# Patient Record
Sex: Female | Born: 1970
Health system: Southern US, Community
[De-identification: ages and names within clinical notes are randomized; demographics above are authoritative.]

## PROBLEM LIST (undated history)

## (undated) DIAGNOSIS — E669 Obesity, unspecified: Secondary | ICD-10-CM

## (undated) DIAGNOSIS — T7840XA Allergy, unspecified, initial encounter: Secondary | ICD-10-CM

## (undated) DIAGNOSIS — IMO0002 Reserved for concepts with insufficient information to code with codable children: Secondary | ICD-10-CM

## (undated) DIAGNOSIS — G4730 Sleep apnea, unspecified: Secondary | ICD-10-CM

## (undated) DIAGNOSIS — M1990 Unspecified osteoarthritis, unspecified site: Secondary | ICD-10-CM

## (undated) DIAGNOSIS — K8050 Calculus of bile duct without cholangitis or cholecystitis without obstruction: Secondary | ICD-10-CM

## (undated) HISTORY — DX: Allergy, unspecified, initial encounter: T78.40XA

## (undated) HISTORY — DX: Unspecified osteoarthritis, unspecified site: M19.90

## (undated) HISTORY — DX: Obesity, unspecified: E66.9

## (undated) HISTORY — DX: Reserved for concepts with insufficient information to code with codable children: IMO0002

## (undated) HISTORY — PX: WISDOM TOOTH EXTRACTION: SHX21

## (undated) HISTORY — DX: Sleep apnea, unspecified: G47.30

## (undated) HISTORY — DX: Calculus of bile duct without cholangitis or cholecystitis without obstruction: K80.50

---

## 2001-05-06 ENCOUNTER — Other Ambulatory Visit: Admission: RE | Admit: 2001-05-06 | Discharge: 2001-05-06 | Payer: Self-pay | Admitting: Obstetrics and Gynecology

## 2001-05-31 ENCOUNTER — Ambulatory Visit (HOSPITAL_COMMUNITY): Admission: RE | Admit: 2001-05-31 | Discharge: 2001-05-31 | Payer: Self-pay | Admitting: Obstetrics and Gynecology

## 2001-05-31 ENCOUNTER — Encounter: Payer: Self-pay | Admitting: Obstetrics and Gynecology

## 2002-07-08 ENCOUNTER — Other Ambulatory Visit: Admission: RE | Admit: 2002-07-08 | Discharge: 2002-07-08 | Payer: Self-pay | Admitting: Obstetrics and Gynecology

## 2004-01-13 ENCOUNTER — Other Ambulatory Visit: Admission: RE | Admit: 2004-01-13 | Discharge: 2004-01-13 | Payer: Self-pay | Admitting: Obstetrics and Gynecology

## 2004-12-27 ENCOUNTER — Ambulatory Visit: Payer: Self-pay | Admitting: Internal Medicine

## 2005-08-14 ENCOUNTER — Other Ambulatory Visit: Admission: RE | Admit: 2005-08-14 | Discharge: 2005-08-14 | Payer: Self-pay | Admitting: Obstetrics and Gynecology

## 2006-06-05 DIAGNOSIS — N870 Mild cervical dysplasia: Secondary | ICD-10-CM | POA: Insufficient documentation

## 2009-02-12 ENCOUNTER — Encounter: Admission: RE | Admit: 2009-02-12 | Discharge: 2009-02-12 | Payer: Self-pay | Admitting: Obstetrics and Gynecology

## 2009-11-15 DIAGNOSIS — R87619 Unspecified abnormal cytological findings in specimens from cervix uteri: Secondary | ICD-10-CM

## 2009-11-15 DIAGNOSIS — IMO0002 Reserved for concepts with insufficient information to code with codable children: Secondary | ICD-10-CM

## 2009-11-15 HISTORY — DX: Unspecified abnormal cytological findings in specimens from cervix uteri: R87.619

## 2009-11-15 HISTORY — DX: Reserved for concepts with insufficient information to code with codable children: IMO0002

## 2010-02-28 ENCOUNTER — Encounter: Admission: RE | Admit: 2010-02-28 | Discharge: 2010-02-28 | Payer: Self-pay | Admitting: Obstetrics and Gynecology

## 2010-12-22 LAB — HM PAP SMEAR: HM Pap smear: NORMAL

## 2011-01-27 ENCOUNTER — Other Ambulatory Visit: Payer: Self-pay | Admitting: Obstetrics and Gynecology

## 2011-01-27 DIAGNOSIS — Z1231 Encounter for screening mammogram for malignant neoplasm of breast: Secondary | ICD-10-CM

## 2011-03-03 ENCOUNTER — Ambulatory Visit
Admission: RE | Admit: 2011-03-03 | Discharge: 2011-03-03 | Disposition: A | Discharge status: Home / Self Care | Payer: BC Managed Care – PPO | Source: Ambulatory Visit | Attending: Obstetrics and Gynecology | Admitting: Obstetrics and Gynecology

## 2011-03-03 DIAGNOSIS — Z1231 Encounter for screening mammogram for malignant neoplasm of breast: Secondary | ICD-10-CM

## 2011-03-03 LAB — HM MAMMOGRAPHY: HM Mammogram: NORMAL

## 2011-08-01 ENCOUNTER — Ambulatory Visit (INDEPENDENT_AMBULATORY_CARE_PROVIDER_SITE_OTHER): Payer: BC Managed Care – PPO | Admitting: Family Medicine

## 2011-08-01 ENCOUNTER — Encounter: Payer: Self-pay | Admitting: Family Medicine

## 2011-08-01 VITALS — BP 102/80 | HR 64 | Temp 97.6°F | Ht 68.75 in | Wt 276.0 lb

## 2011-08-01 DIAGNOSIS — K8050 Calculus of bile duct without cholangitis or cholecystitis without obstruction: Secondary | ICD-10-CM | POA: Insufficient documentation

## 2011-08-01 DIAGNOSIS — Z136 Encounter for screening for cardiovascular disorders: Secondary | ICD-10-CM

## 2011-08-01 DIAGNOSIS — Z0000 Encounter for general adult medical examination without abnormal findings: Secondary | ICD-10-CM

## 2011-08-01 LAB — BASIC METABOLIC PANEL
BUN: 9 mg/dL (ref 6–23)
CO2: 25 mEq/L (ref 19–32)
Calcium: 8.8 mg/dL (ref 8.4–10.5)
Chloride: 102 mEq/L (ref 96–112)
Creatinine, Ser: 0.9 mg/dL (ref 0.4–1.2)
GFR: 73.73 mL/min (ref >60.00)
Glucose, Bld: 75 mg/dL (ref 70–99)
Potassium: 4 mEq/L (ref 3.5–5.1)
Sodium: 135 mEq/L (ref 135–145)

## 2011-08-01 LAB — LIPID PANEL
Cholesterol: 204 mg/dL — ABNORMAL HIGH (ref 0–200)
HDL: 51 mg/dL (ref >39.00)
Total CHOL/HDL Ratio: 4
Triglycerides: 136 mg/dL (ref 0.0–149.0)
VLDL: 27.2 mg/dL (ref 0.0–40.0)

## 2011-08-01 LAB — LDL CHOLESTEROL, DIRECT: Direct LDL: 137.9 mg/dL

## 2011-08-01 NOTE — Progress Notes (Signed)
Subjective:    Patient ID: Rhonda Santiago, female    DOB: [birth year], 41 y.o.   MRN: [id number]  HPI  41 yo here to establish care.  CPX- G2P2, Dr. Normand Sloop is OBGYN. UTD on prevention other than blood work.  Biliary colic- has a h/o biliary colic.  Made this appt initially b/c had some RUQ post prandial pain for 24 hours over a month ago.  No recurrent pain.  Obesity- has gained 80 pounds in past several years.  Husband committed suicide in 2008 and son found him.  She and her children have been having going through therapy and dealing as well as possible with the situation. She has very supportive friends and family.  Patient Active Problem List  Diagnoses  . Biliary colic   Past Medical History  Diagnosis Date  . Biliary colic   . Obesity    No past surgical history on file. History  Substance Use Topics  . Smoking status: Never Smoker   . Smokeless tobacco: Not on file  . Alcohol Use: Not on file   Family History  Problem Relation Age of Onset  . Mental illness Mother    No Known Allergies No current outpatient prescriptions on file prior to visit.   The PMH, PSH, Social History, Family History, Medications, and allergies have been reviewed in [facility], and have been updated if relevant.   Review of Systems See HPI Patient reports no  vision/ hearing changes,anorexia, weight change, fever ,adenopathy, persistant / recurrent hoarseness, swallowing issues, chest pain, edema,persistant / recurrent cough, hemoptysis, dyspnea(rest, exertional, paroxysmal nocturnal), gastrointestinal  bleeding (melena, rectal bleeding), abdominal pain, excessive heart burn, GU symptoms(dysuria, hematuria, pyuria, voiding/incontinence  Issues) syncope, focal weakness, severe memory loss, concerning skin lesions, depression, anxiety, abnormal bruising/bleeding, major joint swelling, breast masses or abnormal vaginal bleeding.       Objective:   Physical Exam BP 102/80  Pulse 64  Temp(Src)  97.6 F (36.4 C) (Oral)  Ht 5' 8.75" (1.746 m)  Wt 276 lb (125.193 kg)  BMI 41.06 kg/m2  LMP 07/24/2011  General:  Well-developed,well-nourished,in no acute distress; alert,appropriate and cooperative throughout examination Head:  normocephalic and atraumatic.   Eyes:  vision grossly intact, pupils equal, pupils round, and pupils reactive to light.   Ears:  R ear normal and L ear normal.   Nose:  no external deformity.   Mouth:  good dentition.   Lungs:  Normal respiratory effort, chest expands symmetrically. Lungs are clear to auscultation, no crackles or wheezes. Heart:  Normal rate and regular rhythm. S1 and S2 normal without gallop, murmur, click, rub or other extra sounds. Abdomen:  Bowel sounds positive,abdomen soft and non-tender without masses, organomegaly or hernias noted. Msk:  No deformity or scoliosis noted of thoracic or lumbar spine.   Extremities:  No clubbing, cyanosis, edema, or deformity noted with normal full range of motion of all joints.   Neurologic:  alert & oriented X3 and gait normal.   Skin:  Intact without suspicious lesions or rashes Psych:  Cognition and judgment appear intact. Alert and cooperative with normal attention span and concentration. No apparent delusions, illusions, hallucinations     Assessment & Plan:   1. Routine general medical examination at a health care facility   Reviewed preventive care protocols, scheduled due services, and updated immunizations Discussed nutrition, exercise, diet, and healthy lifestyle.  Basic Metabolic Panel Lipid Panel  2. Bilary colic Symptoms have resolved.  Discussed watchful waiting.   The patient indicates understanding  of these issues and agrees with the plan.

## 2011-08-01 NOTE — Patient Instructions (Signed)
It was wonderful to meet you. Please let me know if you have any recurrent gall bladder issues. We will call you with the results of your blood work in the next day or two.

## 2011-12-25 ENCOUNTER — Encounter: Payer: Self-pay | Admitting: Obstetrics and Gynecology

## 2011-12-28 ENCOUNTER — Encounter: Payer: Self-pay | Admitting: Obstetrics and Gynecology

## 2011-12-28 ENCOUNTER — Ambulatory Visit (INDEPENDENT_AMBULATORY_CARE_PROVIDER_SITE_OTHER): Payer: BC Managed Care – PPO | Admitting: Obstetrics and Gynecology

## 2011-12-28 VITALS — BP 108/76 | Ht 69.25 in | Wt 278.5 lb

## 2011-12-28 DIAGNOSIS — Z124 Encounter for screening for malignant neoplasm of cervix: Secondary | ICD-10-CM

## 2011-12-28 DIAGNOSIS — Z01419 Encounter for gynecological examination (general) (routine) without abnormal findings: Secondary | ICD-10-CM

## 2011-12-28 DIAGNOSIS — Z1231 Encounter for screening mammogram for malignant neoplasm of breast: Secondary | ICD-10-CM

## 2011-12-28 NOTE — Progress Notes (Signed)
Last Pap: 12/22/2010 WNL: Yes Regular Periods:yes Contraception: abst  Monthly Breast exam:yes Tetanus<24yrs:no ? Nl.Bladder Function:yes Daily BMs:yes Healthy Diet:yes Calcium:no Mammogram:yes Date of [facility]: [facility] Exercise:yes Have often Exercise: occ Seatbelt: yes Abuse at home: no Stressful work:no Sigmoid-colonoscopy: n/a  Bone Density: No PCP: Dr. Dayton Martes Change in PMH: no change Change in FMH:no change BMI=40 Pt without complaints Physical Examination: General appearance - alert, well appearing, and in no distress Mental status - normal mood, behavior, speech, dress, motor activity, and thought processes Neck - supple, no significant adenopathy, thyroid exam: thyroid is normal in size without nodules or tenderness Chest - clear to auscultation, no wheezes, rales or rhonchi, symmetric air entry Heart - normal rate and regular rhythm Abdomen - soft, nontender, nondistended, no masses or organomegaly Breasts - breasts appear normal, no suspicious masses, no skin or nipple changes or axillary nodes Pelvic - normal external genitalia, vulva, vagina, cervix, uterus and adnexa Rectal - normal rectal, no masses, rectal exam not indicated Back exam - full range of motion, no tenderness, palpable spasm or pain on motion Neurological - alert, oriented, normal speech, no focal findings or movement disorder noted Musculoskeletal - no joint tenderness, deformity or swelling Extremities - no edema, redness or tenderness in the calves or thighs Skin - normal coloration and turgor, no rashes, no suspicious skin lesions noted Routine exam Pap sent yes Mammogram due yes 9/13  RT 1 yr or PRN

## 2012-01-01 LAB — PAP IG W/ RFLX HPV ASCU

## 2012-03-04 ENCOUNTER — Ambulatory Visit
Admission: RE | Admit: 2012-03-04 | Discharge: 2012-03-04 | Disposition: A | Discharge status: Home / Self Care | Payer: BC Managed Care – PPO | Source: Ambulatory Visit | Attending: Obstetrics and Gynecology | Admitting: Obstetrics and Gynecology

## 2012-03-04 DIAGNOSIS — Z1231 Encounter for screening mammogram for malignant neoplasm of breast: Secondary | ICD-10-CM

## 2012-10-17 ENCOUNTER — Telehealth: Payer: Self-pay

## 2012-10-17 NOTE — Telephone Encounter (Signed)
Pt traveling this summer to [place] and [place] and wants to know what immunizations are needed.Pt has looked on line for CDC instructions but wanted Dr Dayton Martes to advise her what if any immunizations are needed.

## 2012-10-18 NOTE — Telephone Encounter (Signed)
Routine vaccines should be fine but it depends on what type of activities she will be doing and where she will be going in these countries.  She is due for tetanus so I would have that done (she can do that here) before she leaves.  She should not need hepatitis or other vaccinations unless she plans on going to very remote regions or participating in missionary type activities.

## 2012-10-18 NOTE — Telephone Encounter (Signed)
Advised patient as instructed. 

## 2012-10-22 ENCOUNTER — Ambulatory Visit (INDEPENDENT_AMBULATORY_CARE_PROVIDER_SITE_OTHER): Payer: BC Managed Care – PPO | Admitting: *Deleted

## 2012-10-22 DIAGNOSIS — Z23 Encounter for immunization: Secondary | ICD-10-CM

## 2013-01-13 ENCOUNTER — Other Ambulatory Visit: Payer: Self-pay

## 2013-01-13 DIAGNOSIS — Z1231 Encounter for screening mammogram for malignant neoplasm of breast: Secondary | ICD-10-CM

## 2013-03-05 ENCOUNTER — Ambulatory Visit
Admission: RE | Admit: 2013-03-05 | Discharge: 2013-03-05 | Disposition: A | Discharge status: Home / Self Care | Payer: BC Managed Care – PPO | Source: Ambulatory Visit

## 2013-03-05 DIAGNOSIS — Z1231 Encounter for screening mammogram for malignant neoplasm of breast: Secondary | ICD-10-CM

## 2013-06-04 ENCOUNTER — Telehealth: Payer: Self-pay | Admitting: Family Medicine

## 2013-06-04 NOTE — Telephone Encounter (Signed)
Patient Information:  Caller Name: [name]  Phone: [phone]  Patient: Rhonda Santiago, Rhonda Santiago  Gender: Female  DOB: Jan 03, 1971  Age: 42 Years  PCP: Ruthe Mannan [facility])  Pregnant: No  Office Follow Up:  Does the office need to follow up with this patient?: No  Instructions For The Office: N/A  RN Note:  Patient states she has history of Hemorrhoids. Patient states she developed rectal pain, protrusion of hemorrhoid, onset 05/30/13. Patient states she has tried Preparation H and Tucks without improvement. Patient states she noted a small amount of bright red blood on the toilet tissue 06/04/13 after passing a bowel movement. Patient states she has an appt. scheduled for 06/09/12.  Care advice given per guidelines. Patient advised Baking soda sitz baths BID. Patient advised to keep the Preparation H and Tucks in the refrigerator. Patient advised may try Anusol HC Suppositories. Call back parameters reviewed. Patient advised to keep scheduled appt. as planned.  Advised to return call sooner if sx increase. Patient verbalizes understanding and agreeable.  Symptoms  Reason For Call & Symptoms: Hemorrhoids, rectal bleeding  Reviewed Health History In EMR: Yes  Reviewed Medications In EMR: Yes  Reviewed Allergies In EMR: Yes  Reviewed Surgeries / Procedures: Yes  Date of Onset of Symptoms: 05/30/2013  Treatments Tried: Preparation H, Tucks, rest  Treatments Tried Worked: No OB / GYN:  LMP: 05/30/2013  Guideline(s) Used:  Rectal Symptoms  Disposition Per Guideline:   Home Care  Reason For Disposition Reached:   Mild rectal pain  Advice Given:  Treatment of Mild Rectal Pain:  Rectal pain and irritation can often be caused by either hemorrhoids or a tiny tear in the rectal opening (anal fissure). Small drops of blood can sometimes be seen on the toilet paper or stool in people with hemorrhoids or rectal irritation from hard bowel movements.  Warm SITZ Bath Twice a Day - Sit  in a warm saline bath for 20 minutes bid to cleanse the area and to promote healing. Add 2 ounces (57 grams) of table salt or baking soda to each tub of water. Afterwards, gently pat area dry with unscented toilet paper.  Topical Hydrocortisone Twice a Day - After taking a Sitz bath and drying your rectal area, apply 1% hydrocortisone ointment (OTC) bid to reduce irritation. Hydrocortisone is found in various OTC hemorrhoid medications (e.g., Anusol HC, Preparation H Hydrocortisone, Analpram HC Cream).  Call Back If:  Rectal bleeding (i.e., more than just a few drops on toilet paper from wiping)  You become worse.  Patient Will Follow Care Advice:  YES

## 2013-06-09 ENCOUNTER — Encounter: Payer: Self-pay | Admitting: Internal Medicine

## 2013-06-09 ENCOUNTER — Ambulatory Visit (INDEPENDENT_AMBULATORY_CARE_PROVIDER_SITE_OTHER): Payer: BC Managed Care – PPO | Admitting: Internal Medicine

## 2013-06-09 VITALS — BP 108/82 | HR 88 | Temp 97.9°F | Wt 277.5 lb

## 2013-06-09 DIAGNOSIS — K649 Unspecified hemorrhoids: Secondary | ICD-10-CM

## 2013-06-09 MED ORDER — HYDROCORTISONE 2.5 % RE CREA: 1.0000 "application " | TOPICAL_CREAM | Freq: Two times a day (BID) | RECTAL | Status: DC

## 2013-06-09 NOTE — Progress Notes (Signed)
Pre-visit discussion using our clinic review tool. No additional management support is needed unless otherwise documented below in the visit note.  

## 2013-06-09 NOTE — Patient Instructions (Signed)
Hemorrhoids Hemorrhoids are swollen veins around the rectum or anus. There are two types of hemorrhoids:   Internal hemorrhoids. These occur in the veins just inside the rectum. They may poke through to the outside and become irritated and painful.  External hemorrhoids. These occur in the veins outside the anus and can be felt as a painful swelling or hard lump near the anus. CAUSES  Pregnancy.   Obesity.   Constipation or diarrhea.   Straining to have a bowel movement.   Sitting for long periods on the toilet.  Heavy lifting or other activity that caused you to strain.  Anal intercourse. SYMPTOMS   Pain.   Anal itching or irritation.   Rectal bleeding.   Fecal leakage.   Anal swelling.   One or more lumps around the anus.  DIAGNOSIS  Your caregiver may be able to diagnose hemorrhoids by visual examination. Other examinations or tests that may be performed include:   Examination of the rectal area with a gloved hand (digital rectal exam).   Examination of anal canal using a small tube (scope).   A blood test if you have lost a significant amount of blood.  A test to look inside the colon (sigmoidoscopy or colonoscopy). TREATMENT Most hemorrhoids can be treated at home. However, if symptoms do not seem to be getting better or if you have a lot of rectal bleeding, your caregiver may perform a procedure to help make the hemorrhoids get smaller or remove them completely. Possible treatments include:   Placing a rubber band at the base of the hemorrhoid to cut off the circulation (rubber band ligation).   Injecting a chemical to shrink the hemorrhoid (sclerotherapy).   Using a tool to burn the hemorrhoid (infrared light therapy).   Surgically removing the hemorrhoid (hemorrhoidectomy).   Stapling the hemorrhoid to block blood flow to the tissue (hemorrhoid stapling).  HOME CARE INSTRUCTIONS   Eat foods with fiber, such as whole grains, beans,  nuts, fruits, and vegetables. Ask your doctor about taking products with added fiber in them (fibersupplements).  Increase fluid intake. Drink enough water and fluids to keep your urine clear or pale yellow.   Exercise regularly.   Go to the bathroom when you have the urge to have a bowel movement. Do not wait.   Avoid straining to have bowel movements.   Keep the anal area dry and clean. Use wet toilet paper or moist towelettes after a bowel movement.   Medicated creams and suppositories may be used or applied as directed.   Only take over-the-counter or prescription medicines as directed by your caregiver.   Take warm sitz baths for 15 20 minutes, 3 4 times a day to ease pain and discomfort.   Place ice packs on the hemorrhoids if they are tender and swollen. Using ice packs between sitz baths may be helpful.   Put ice in a plastic bag.   Place a towel between your skin and the bag.   Leave the ice on for 15 20 minutes, 3 4 times a day.   Do not use a donut-shaped pillow or sit on the toilet for long periods. This increases blood pooling and pain.  SEEK MEDICAL CARE IF:  You have increasing pain and swelling that is not controlled by treatment or medicine.  You have uncontrolled bleeding.  You have difficulty or you are unable to have a bowel movement.  You have pain or inflammation outside the area of the hemorrhoids. MAKE SURE YOU:    Understand these instructions.  Will watch your condition.  Will get help right away if you are not doing well or get worse. Document Released: 05/19/2000 Document Revised: 05/08/2012 Document Reviewed: 03/26/2012 Exit[employer] Patient Information 2014 [employer].  

## 2013-06-09 NOTE — Progress Notes (Signed)
Subjective:    Patient ID: Rhonda Santiago, female    DOB: 1971-03-06, 43 y.o.   MRN: [id number]  HPI  Pt presents to the clinic today with c/o hemorrhoids. She has experienced some anal itching and tenderness. This started about 10 days ago. She has tried the preparation H pads OTC without much relief. She has noticed some blood when she wipes. She did develop hemorrhoids with her last pregnancy.   Review of Systems      Past Medical History  Diagnosis Date  . Biliary colic   . Obesity   . Abnormal Pap smear 11/15/2009    ASCUS / colpo 12/15/2009 low grade squamous , CIN1     Current Outpatient Prescriptions  Medication Sig Dispense Refill  . Multiple Vitamin (MULTIVITAMIN) tablet Take 1 tablet by mouth daily.       No current facility-administered medications for this visit.    No Known Allergies  Family History  Problem Relation Age of Onset  . Depression Father   . Brain cancer Maternal Aunt   . Stroke Maternal Grandmother   . Heart disease Maternal Grandfather   . Dementia Paternal Grandmother   . Cancer Paternal Grandfather     lung    History   Social History  . Marital Status: Widowed    Spouse Name: N/A    Number of Children: N/A  . Years of Education: N/A   Occupational History  . Not on file.   Social History Main Topics  . Smoking status: Never Smoker   . Smokeless tobacco: Never Used  . Alcohol Use: No  . Drug Use: No  . Sexual Activity: No   Other Topics Concern  . Not on file   Social History Narrative   Husband committed suicide in 2008.   Caring for her two children- ages 42 and 48.   Works at [employer].     Constitutional: Denies fever, malaise, fatigue, headache or abrupt weight changes.   Gastrointestinal: Pt reports anal itching. Denies abdominal pain, bloating, constipation, diarrhea.  .   No other specific complaints in a complete review of systems (except as listed in HPI above).  Objective:   Physical Exam   BP 108/82   Pulse 88  Temp(Src) 97.9 F (36.6 C) (Oral)  Wt 277 lb 8 oz (125.873 kg)  SpO2 99% Wt Readings from Last 3 Encounters:  06/09/13 277 lb 8 oz (125.873 kg)  12/28/11 278 lb 8 oz (126.327 kg)  08/01/11 276 lb (125.193 kg)    General: Appears her stated age, obese well developed, well nourished in NAD. Cardiovascular: Normal rate and rhythm. S1,S2 noted.  No murmur, rubs or gallops noted. No JVD or BLE edema. No carotid bruits noted. Pulmonary/Chest: Normal effort and positive vesicular breath sounds. No respiratory distress. No wheezes, rales or ronchi noted.  Rectal: Large external hemorrhoid noted at 11:00, not thrombosed.  BMET    Component Value Date/Time   NA 135 08/01/2011 1155   K 4.0 08/01/2011 1155   CL 102 08/01/2011 1155   CO2 25 08/01/2011 1155   GLUCOSE 75 08/01/2011 1155   BUN 9 08/01/2011 1155   CREATININE 0.9 08/01/2011 1155   CALCIUM 8.8 08/01/2011 1155    Lipid Panel     Component Value Date/Time   CHOL 204* 08/01/2011 1155   TRIG 136.0 08/01/2011 1155   HDL 51.00 08/01/2011 1155   CHOLHDL 4 08/01/2011 1155   VLDL 27.2 08/01/2011 1155    CBC No results  found for this basename: wbc, rbc, hgb, hct, plt, mcv, mch, mchc, rdw, neutrabs, lymphsabs, monoabs, eosabs, basosabs    Hgb A1C No results found for this basename: HGBA1C        Assessment & Plan:   Hemorrhoids:  Advised pt to drink plenty of water to avoid hard stools. May need to take a stool softenor such as colace on occasion. eRx for Anusol HCT external cream  RTC as needed

## 2014-01-13 ENCOUNTER — Other Ambulatory Visit: Payer: Self-pay

## 2014-01-13 DIAGNOSIS — Z1231 Encounter for screening mammogram for malignant neoplasm of breast: Secondary | ICD-10-CM

## 2014-03-09 ENCOUNTER — Ambulatory Visit
Admission: RE | Admit: 2014-03-09 | Discharge: 2014-03-09 | Disposition: A | Discharge status: Home / Self Care | Payer: BC Managed Care – PPO | Source: Ambulatory Visit

## 2014-03-09 ENCOUNTER — Encounter (INDEPENDENT_AMBULATORY_CARE_PROVIDER_SITE_OTHER): Payer: Self-pay

## 2014-03-09 DIAGNOSIS — Z1231 Encounter for screening mammogram for malignant neoplasm of breast: Secondary | ICD-10-CM

## 2014-04-06 ENCOUNTER — Encounter: Payer: Self-pay | Admitting: Internal Medicine

## 2014-04-14 ENCOUNTER — Ambulatory Visit (INDEPENDENT_AMBULATORY_CARE_PROVIDER_SITE_OTHER): Payer: BC Managed Care – PPO | Admitting: Family Medicine

## 2014-04-14 ENCOUNTER — Encounter: Payer: Self-pay | Admitting: Family Medicine

## 2014-04-14 VITALS — BP 128/78 | HR 84 | Temp 97.8°F | Wt 290.5 lb

## 2014-04-14 DIAGNOSIS — R079 Chest pain, unspecified: Secondary | ICD-10-CM | POA: Insufficient documentation

## 2014-04-14 DIAGNOSIS — E669 Obesity, unspecified: Secondary | ICD-10-CM | POA: Insufficient documentation

## 2014-04-14 LAB — COMPREHENSIVE METABOLIC PANEL
ALT: 10 U/L (ref 0–35)
AST: 17 U/L (ref 0–37)
Albumin: 3.1 g/dL — ABNORMAL LOW (ref 3.5–5.2)
Alkaline Phosphatase: 81 U/L (ref 39–117)
BUN: 11 mg/dL (ref 6–23)
CO2: 25 mEq/L (ref 19–32)
Calcium: 8.7 mg/dL (ref 8.4–10.5)
Chloride: 105 mEq/L (ref 96–112)
Creatinine, Ser: 0.8 mg/dL (ref 0.4–1.2)
GFR: 88.72 mL/min (ref >60.00)
Glucose, Bld: 89 mg/dL (ref 70–99)
Potassium: 3.9 mEq/L (ref 3.5–5.1)
Sodium: 137 mEq/L (ref 135–145)
Total Bilirubin: 0.4 mg/dL (ref 0.2–1.2)
Total Protein: 7.4 g/dL (ref 6.0–8.3)

## 2014-04-14 LAB — CBC WITH DIFFERENTIAL/PLATELET
Basophils Absolute: 0 10*3/uL (ref 0.0–0.1)
Basophils Relative: 0.7 % (ref 0.0–3.0)
Eosinophils Absolute: 0.1 10*3/uL (ref 0.0–0.7)
Eosinophils Relative: 1.9 % (ref 0.0–5.0)
HCT: 35.4 % — ABNORMAL LOW (ref 36.0–46.0)
Hemoglobin: 11.6 g/dL — ABNORMAL LOW (ref 12.0–15.0)
Lymphocytes Relative: 30.5 % (ref 12.0–46.0)
Lymphs Abs: 2.1 10*3/uL (ref 0.7–4.0)
MCHC: 32.9 g/dL (ref 30.0–36.0)
MCV: 81.4 fl (ref 78.0–100.0)
Monocytes Absolute: 0.6 10*3/uL (ref 0.1–1.0)
Monocytes Relative: 8.5 % (ref 3.0–12.0)
Neutro Abs: 3.9 10*3/uL (ref 1.4–7.7)
Neutrophils Relative %: 58.4 % (ref 43.0–77.0)
Platelets: 320 10*3/uL (ref 150.0–400.0)
RBC: 4.35 Mil/uL (ref 3.87–5.11)
RDW: 15.5 % (ref 11.5–15.5)
WBC: 6.7 10*3/uL (ref 4.0–10.5)

## 2014-04-14 LAB — LIPID PANEL
Cholesterol: 190 mg/dL (ref 0–200)
HDL: 45.6 mg/dL (ref >39.00)
LDL Cholesterol: 126 mg/dL — ABNORMAL HIGH (ref 0–99)
NonHDL: 144.4
Total CHOL/HDL Ratio: 4
Triglycerides: 91 mg/dL (ref 0.0–149.0)
VLDL: 18.2 mg/dL (ref 0.0–40.0)

## 2014-04-14 LAB — LIPASE: Lipase: 34 U/L (ref 11.0–59.0)

## 2014-04-14 LAB — TSH: TSH: 1.07 u[IU]/mL (ref 0.35–4.50)

## 2014-04-14 NOTE — Assessment & Plan Note (Signed)
EKG reassuring. Unlikely cardiac. Will get lipid panel and other labs for further risk stratification. ?GERD- advised prilosec 20 mg daily for two weeks. The patient indicates understanding of these issues and agrees with the plan.  Orders Placed This Encounter  Procedures  . CBC with Differential  . Comprehensive metabolic panel  . Lipid panel  . TSH  . Lipase  . EKG 12-Lead

## 2014-04-14 NOTE — Progress Notes (Signed)
Subjective:    Patient ID: Rhonda Santiago, female    DOB: March 10, 1971, 43 y.o.   MRN: [id number]  HPI  43 yo pt whom I have not seen in years here today for several issues.    Most pressing today is that she had chest pain at rest yesterday.  She also had some burping.  No nausea, vomiting or diaphoresis.  She does have "family history of heart issues" but states no one died early of an MI.  She is a non smoker.  Has not had pain since.  Lab Results  Component Value Date   CHOL 204* 08/01/2011   HDL 51.00 08/01/2011   LDLDIRECT 137.9 08/01/2011   TRIG 136.0 08/01/2011   CHOLHDL 4 08/01/2011   Obesity- she is aware that she needs to lose weight.  Has not been very active. She lost 80 pounds in her 30s and says "she knows what to do."  Current Outpatient Prescriptions on File Prior to Visit  Medication Sig Dispense Refill  . hydrocortisone (ANUSOL-HC) 2.5 % rectal cream Place 1 application rectally 2 (two) times daily. 30 g 0  . Multiple Vitamin (MULTIVITAMIN) tablet Take 1 tablet by mouth daily.     No current facility-administered medications on file prior to visit.    No Known Allergies  Past Medical History  Diagnosis Date  . Biliary colic   . Obesity   . Abnormal Pap smear 11/15/2009    ASCUS / colpo 12/15/2009 low grade squamous , CIN1     Past Surgical History  Procedure Laterality Date  . Wisdom tooth extraction      Family History  Problem Relation Age of Onset  . Depression Father   . Brain cancer Maternal Aunt   . Stroke Maternal Grandmother   . Heart disease Maternal Grandfather   . Dementia Paternal Grandmother   . Cancer Paternal Grandfather     lung    History   Social History  . Marital Status: Widowed    Spouse Name: N/A    Number of Children: N/A  . Years of Education: N/A   Occupational History  . Not on file.   Social History Main Topics  . Smoking status: Never Smoker   . Smokeless tobacco: Never Used  . Alcohol Use: No  .  Drug Use: No  . Sexual Activity: No   Other Topics Concern  . Not on file   Social History Narrative   Husband committed suicide in 2008.   Caring for her two children- ages 7015 and 4112.   Works at [employer].   The PMH, PSH, Social History, Family History, Medications, and allergies [facility], and have been updated if relevant.   Review of Systems See HPI +belching +snoring No SOB +fatigue    Objective:   Physical Exam  Constitutional: She is oriented to person, place, and time. She appears well-developed and well-nourished. No distress.  Seems to be having a very hard time focusing on one topic-it took several minutes of talking with her to understand why she was here today.  HENT:  Head: Normocephalic.  Cardiovascular: Normal rate and regular rhythm.   Pulmonary/Chest: Effort normal and breath sounds normal.  Neurological: She is alert and oriented to person, place, and time.  Skin: Skin is warm and dry.  Psychiatric:  Tangential speech    BP 128/78 mmHg  Pulse 84  Temp(Src) 97.8 F (36.6 C) (Oral)  Wt 290 lb 8 oz (131.77 kg)  SpO2 97%  LMP  (LMP Unknown)       Assessment & Plan:

## 2014-04-14 NOTE — Progress Notes (Signed)
Pre visit review using our clinic review tool, if applicable. No additional management support is needed unless otherwise documented below in the visit note. 

## 2014-04-14 NOTE — Patient Instructions (Addendum)
Good to see you. Your EKG looked good. We will call you with your lab results and you can view them online.  Please take prilosec 20 mg daily for the next two weeks. Call me if you develop any new or worsening symptoms.

## 2014-04-16 ENCOUNTER — Telehealth: Payer: Self-pay | Admitting: Family Medicine

## 2014-04-16 NOTE — Telephone Encounter (Signed)
She can increase foods higher in iron but also take a prenatal vitamin which should have enough iron to replete this.

## 2014-04-16 NOTE — Telephone Encounter (Signed)
Pt called back: phone dis-connected while talking to [place] about labs. Pt would like to know what she could do differently since she is mildly anemic? Please advise

## 2014-04-17 NOTE — Telephone Encounter (Signed)
Lm on pts vm requesting a call back 

## 2014-04-17 NOTE — Telephone Encounter (Signed)
Spoke to pt and advised per Dr Aron.  

## 2014-04-29 ENCOUNTER — Ambulatory Visit (INDEPENDENT_AMBULATORY_CARE_PROVIDER_SITE_OTHER): Payer: BC Managed Care – PPO | Admitting: Family Medicine

## 2014-04-29 ENCOUNTER — Encounter: Payer: Self-pay | Admitting: Family Medicine

## 2014-04-29 VITALS — BP 118/80 | HR 80 | Temp 98.2°F | Ht 69.0 in | Wt 292.8 lb

## 2014-04-29 DIAGNOSIS — B349 Viral infection, unspecified: Secondary | ICD-10-CM

## 2014-04-29 DIAGNOSIS — J029 Acute pharyngitis, unspecified: Secondary | ICD-10-CM

## 2014-04-29 LAB — POCT RAPID STREP A (OFFICE): Rapid Strep A Screen: NEGATIVE

## 2014-04-29 MED ORDER — AMOXICILLIN 500 MG PO CAPS: 1000.0000 mg | ORAL_CAPSULE | Freq: Two times a day (BID) | ORAL | Status: DC

## 2014-04-29 NOTE — Progress Notes (Signed)
   Dr. Karleen HampshireSpencer T. Kerrion Kemppainen, MD, CAQ Sports Medicine Primary [facility] and Sports Medicine [address] [place], [id number] Phone: [phone] Fax: [phone]  04/29/2014  Patient: Rhonda Santiago, MRN: [id number], DOB: March 27, 1971, 43 y.o.  Primary Physician:  Ruthe Mannanalia Aron, MD  Chief Complaint: Sore Throat; Ear Pressure; and Sore on Tongue   Subjective:   Rhonda Santiago is a 43 y.o. very pleasant female patient who presents with the following:  A couple of days ago, felt like somee drainage down the right side and last night, right ear was really sore. Now feeling some pressure in her ear. She has been having some significant sore throat.  She is also had some drainage, pressure, and some mild cough.  She is currently afebrile.  She has been able to eat and drink, but this is diminished somewhat.  R side of tongue it is sore.   Past Medical History, Surgical History, Social History, Family History, Problem List, Medications, and Allergies have been reviewed and updated if relevant.  ROS: GEN: Acute illness details above GI: Tolerating PO intake GU: maintaining adequate hydration and urination Pulm: No SOB Interactive and getting along well at home.  Otherwise, ROS is as per the HPI.   Objective:   BP 118/80 mmHg  Pulse 80  Temp(Src) 98.2 F (36.8 C) (Oral)  Ht 5\' 9"  (1.753 m)  Wt 292 lb 12 oz (132.791 kg)  BMI 43.21 kg/m2  LMP  (LMP Unknown)   Gen: WDWN, NAD; A & O x3, cooperative. Pleasant.Globally Non-toxic HEENT: Normocephalic and atraumatic. Throat clear, w/o exudate, R TM , L TM - good landmarks, + fluid present. rhinnorhea.  MMM Frontal sinuses: NT Max sinuses: NT NECK: Anterior cervical  LAD is absent CV: RRR, No M/G/R, cap refill <2 sec PULM: Breathing comfortably in no respiratory distress. no wheezing, crackles, rhonchi EXT: No c/c/e PSYCH: Friendly, good eye contact MSK: Nml gait     Laboratory and Imaging Data: Results for orders placed or  performed in visit on 04/29/14  POCT rapid strep A  Result Value Ref Range   Rapid Strep A Screen Negative Negative     Assessment and Plan:   Viral syndrome  Sore throat - Plan: POCT rapid strep A  Generally healthy patient.  Most likely viral illness.  She does have some significant ear discomfort.  Given the Thanksgiving holiday, I am going to give her some antibiotics to hold on paper prescription.  As long as she feels better, continue with supportive care.  Follow-up: No Follow-up on file.  New Prescriptions   AMOXICILLIN (AMOXIL) 500 MG CAPSULE    Take 2 capsules (1,000 mg total) by mouth 2 (two) times daily.   Orders Placed This Encounter  Procedures  . POCT rapid strep A    Signed,  Ohn Bostic T. Eduard Penkala, MD   Patient's Medications  New Prescriptions   AMOXICILLIN (AMOXIL) 500 MG CAPSULE    Take 2 capsules (1,000 mg total) by mouth 2 (two) times daily.  Previous Medications   MULTIPLE VITAMIN (MULTIVITAMIN) TABLET    Take 1 tablet by mouth daily.  Modified Medications   No medications on file  Discontinued Medications   HYDROCORTISONE (ANUSOL-HC) 2.5 % RECTAL CREAM    Place 1 application rectally 2 (two) times daily.

## 2014-04-29 NOTE — Progress Notes (Signed)
Pre visit review using our clinic review tool, if applicable. No additional management support is needed unless otherwise documented below in the visit note. 

## 2014-12-08 ENCOUNTER — Encounter: Payer: Self-pay | Admitting: Family Medicine

## 2014-12-08 ENCOUNTER — Ambulatory Visit (INDEPENDENT_AMBULATORY_CARE_PROVIDER_SITE_OTHER): Payer: BLUE CROSS/BLUE SHIELD | Admitting: Family Medicine

## 2014-12-08 VITALS — BP 122/80 | HR 97 | Temp 98.4°F | Ht 69.0 in | Wt 294.0 lb

## 2014-12-08 DIAGNOSIS — R1013 Epigastric pain: Secondary | ICD-10-CM | POA: Diagnosis not present

## 2014-12-08 LAB — COMPREHENSIVE METABOLIC PANEL
ALT: 14 U/L (ref 0–35)
AST: 17 U/L (ref 0–37)
Albumin: 3.9 g/dL (ref 3.5–5.2)
Alkaline Phosphatase: 92 U/L (ref 39–117)
BUN: 12 mg/dL (ref 6–23)
CO2: 27 mEq/L (ref 19–32)
Calcium: 9.6 mg/dL (ref 8.4–10.5)
Chloride: 102 mEq/L (ref 96–112)
Creatinine, Ser: 0.81 mg/dL (ref 0.40–1.20)
GFR: 80.94 mL/min (ref >60.00)
Glucose, Bld: 96 mg/dL (ref 70–99)
Potassium: 3.8 mEq/L (ref 3.5–5.1)
Sodium: 136 mEq/L (ref 135–145)
Total Bilirubin: 0.2 mg/dL (ref 0.2–1.2)
Total Protein: 8 g/dL (ref 6.0–8.3)

## 2014-12-08 LAB — CBC WITH DIFFERENTIAL/PLATELET
Basophils Absolute: 0 10*3/uL (ref 0.0–0.1)
Basophils Relative: 0.4 % (ref 0.0–3.0)
Eosinophils Absolute: 0.2 10*3/uL (ref 0.0–0.7)
Eosinophils Relative: 1.9 % (ref 0.0–5.0)
HCT: 38.2 % (ref 36.0–46.0)
Hemoglobin: 12.4 g/dL (ref 12.0–15.0)
Lymphocytes Relative: 27.3 % (ref 12.0–46.0)
Lymphs Abs: 2.3 10*3/uL (ref 0.7–4.0)
MCHC: 32.4 g/dL (ref 30.0–36.0)
MCV: 80.3 fl (ref 78.0–100.0)
Monocytes Absolute: 0.5 10*3/uL (ref 0.1–1.0)
Monocytes Relative: 6.6 % (ref 3.0–12.0)
Neutro Abs: 5.3 10*3/uL (ref 1.4–7.7)
Neutrophils Relative %: 63.8 % (ref 43.0–77.0)
Platelets: 337 10*3/uL (ref 150.0–400.0)
RBC: 4.76 Mil/uL (ref 3.87–5.11)
RDW: 15.8 % — ABNORMAL HIGH (ref 11.5–15.5)
WBC: 8.3 10*3/uL (ref 4.0–10.5)

## 2014-12-08 LAB — LIPASE: Lipase: 28 U/L (ref 11.0–59.0)

## 2014-12-08 NOTE — Assessment & Plan Note (Signed)
Will eval with labs. Rule out H pylori.  Treat possible GERD and gastritis with PPI.  Send for US to eval for gallstones.. No red flags at this time for emergent  Surgical referral.

## 2014-12-08 NOTE — Progress Notes (Signed)
Pre visit review using our clinic review tool, if applicable. No additional management support is needed unless otherwise documented below in the visit note. 

## 2014-12-08 NOTE — Progress Notes (Signed)
   Subjective:    Patient ID: Rhonda Santiago, female    DOB: 1970/06/15, 44 y.o.   MRN: [id number]  HPI 44 year old obese female pt of Dr. Elmer SowAron's presents with new onset  pain in epigastrim/RUQ  2 days ago, several hours after eating steak dinner. No radiation of pain. Did not take any med. Mild nausea. No vomiting.  When she woke up next morning she felt better.  She has tried to eat more bland foods.  Today she felt epigastric pain, return but  milder, sore. No fever.  No D/C. She has frequent heartburn, burping a lot, this common for her. Not on PPI.   Hx of gallstones diagnosed 22 years ago... Seen on US.   Dr. Doristine CounterBurnett treated her. She did not want surgery at that time.  Treated with actigall.  Her symptoms resolved except milder pain off and on.Marland Kitchen.1-2 times a year Not bad enough to do anything about.    Review of Systems  Constitutional: Negative for fever and fatigue.  HENT: Negative for ear pain.   Eyes: Negative for pain.  Respiratory: Negative for chest tightness and shortness of breath.   Cardiovascular: Negative for chest pain, palpitations and leg swelling.  Gastrointestinal: Negative for abdominal pain.  Genitourinary: Negative for dysuria.       Objective:   Physical Exam  Constitutional: Vital signs are normal. She appears well-developed and well-nourished. She is cooperative.  Non-toxic appearance. She does not appear ill. No distress.  HENT:  Head: Normocephalic.  Right Ear: Hearing, tympanic membrane, external ear and ear canal normal. Tympanic membrane is not erythematous, not retracted and not bulging.  Left Ear: Hearing, tympanic membrane, external ear and ear canal normal. Tympanic membrane is not erythematous, not retracted and not bulging.  Nose: No mucosal edema or rhinorrhea. Right sinus exhibits no maxillary sinus tenderness and no frontal sinus tenderness. Left sinus exhibits no maxillary sinus tenderness and no frontal sinus tenderness.    Mouth/Throat: Uvula is midline, oropharynx is clear and moist and mucous membranes are normal.  Eyes: Conjunctivae, EOM and lids are normal. Pupils are equal, round, and reactive to light. Lids are everted and swept, no foreign bodies found.  Neck: Trachea normal and normal range of motion. Neck supple. Carotid bruit is not present. No thyroid mass and no thyromegaly present.  Cardiovascular: Normal rate, regular rhythm, S1 normal, S2 normal, normal heart sounds, intact distal pulses and normal pulses.  Exam reveals no gallop and no friction rub.   No murmur heard. Pulmonary/Chest: Effort normal and breath sounds normal. No tachypnea. No respiratory distress. She has no decreased breath sounds. She has no wheezes. She has no rhonchi. She has no rales.  Abdominal: Soft. Normal appearance and bowel sounds are normal. There is tenderness in the right upper quadrant and epigastric area. There is no rebound and no CVA tenderness.    Neurological: She is alert.  Skin: Skin is warm, dry and intact. No rash noted.  Psychiatric: Her speech is normal and behavior is normal. Judgment and thought content normal. Her mood appears not anxious. Cognition and memory are normal. She does not exhibit a depressed mood.          Assessment & Plan:

## 2014-12-08 NOTE — Patient Instructions (Signed)
Stop at lab on way out  Stop at front desk for referral to US test.  Start prilosec OTC 2 tabs x 20 mg daily to decrease acid in stomach.  Got to ER with severe pain.

## 2014-12-09 ENCOUNTER — Ambulatory Visit
Admission: RE | Admit: 2014-12-09 | Discharge: 2014-12-09 | Disposition: A | Discharge status: Home / Self Care | Payer: BLUE CROSS/BLUE SHIELD | Source: Ambulatory Visit | Attending: Family Medicine | Admitting: Family Medicine

## 2014-12-09 DIAGNOSIS — K8020 Calculus of gallbladder without cholecystitis without obstruction: Secondary | ICD-10-CM | POA: Diagnosis not present

## 2014-12-09 DIAGNOSIS — R1013 Epigastric pain: Secondary | ICD-10-CM | POA: Diagnosis present

## 2014-12-09 DIAGNOSIS — R1011 Right upper quadrant pain: Secondary | ICD-10-CM | POA: Diagnosis present

## 2014-12-11 ENCOUNTER — Telehealth: Payer: Self-pay | Admitting: Family Medicine

## 2014-12-11 ENCOUNTER — Encounter: Payer: Self-pay | Admitting: Family Medicine

## 2014-12-11 DIAGNOSIS — K8010 Calculus of gallbladder with chronic cholecystitis without obstruction: Secondary | ICD-10-CM

## 2014-12-11 NOTE — Telephone Encounter (Signed)
-----   Message from Damita Lackonna S Loring, CMA sent at 12/11/2014  9:09 AM EDT ----- Carollee HerterShannon notified as instructed by telephone.  She is agreeable with the general surgeon referral.

## 2015-01-04 ENCOUNTER — Ambulatory Visit: Payer: Self-pay | Admitting: Surgery

## 2015-01-22 ENCOUNTER — Other Ambulatory Visit: Payer: Self-pay | Admitting: Medical

## 2015-01-22 ENCOUNTER — Ambulatory Visit
Admission: RE | Admit: 2015-01-22 | Discharge: 2015-01-22 | Disposition: A | Discharge status: Home / Self Care | Payer: BLUE CROSS/BLUE SHIELD | Source: Ambulatory Visit | Attending: Medical | Admitting: Medical

## 2015-01-22 DIAGNOSIS — M79644 Pain in right finger(s): Secondary | ICD-10-CM | POA: Diagnosis present

## 2015-01-22 DIAGNOSIS — R52 Pain, unspecified: Secondary | ICD-10-CM

## 2015-01-22 DIAGNOSIS — R609 Edema, unspecified: Secondary | ICD-10-CM

## 2015-04-27 ENCOUNTER — Telehealth: Payer: Self-pay

## 2015-04-27 NOTE — Telephone Encounter (Signed)
Pt left v/m requesting cb about immunizations for international trip. Left v/m requesting pt to cb.

## 2015-04-27 NOTE — Telephone Encounter (Signed)
Pt will be traveling to [place] and wants to know what immunizations pt will need;advised to ck with health dept travel advisory or CDC website/ pt will cb if needed.

## 2015-05-03 ENCOUNTER — Telehealth: Payer: Self-pay | Admitting: Family Medicine

## 2015-05-03 NOTE — Telephone Encounter (Signed)
[place] [address] and needs to have her vaccine information. Can you print out her vaccine history?  She has found out from the cdc that most travelers gert Hep A and thyphoid.  cb number is [phone]

## 2015-05-03 NOTE — Telephone Encounter (Signed)
This pt has not had any immunizations here except a TDap in 2014

## 2015-05-25 ENCOUNTER — Ambulatory Visit: Payer: Self-pay | Admitting: Surgery

## 2015-06-03 ENCOUNTER — Ambulatory Visit (INDEPENDENT_AMBULATORY_CARE_PROVIDER_SITE_OTHER): Payer: BLUE CROSS/BLUE SHIELD | Admitting: Internal Medicine

## 2015-06-03 ENCOUNTER — Encounter: Payer: Self-pay | Admitting: Internal Medicine

## 2015-06-03 VITALS — BP 128/76 | HR 81 | Temp 98.1°F | Wt 286.0 lb

## 2015-06-03 DIAGNOSIS — J069 Acute upper respiratory infection, unspecified: Secondary | ICD-10-CM | POA: Diagnosis not present

## 2015-06-03 MED ORDER — AZITHROMYCIN 250 MG PO TABS: ORAL_TABLET | ORAL | Status: DC

## 2015-06-03 NOTE — Patient Instructions (Signed)

## 2015-06-03 NOTE — Progress Notes (Signed)
HPI  Pt presents to the clinic today with c/o nasal congestion and cough. This started 1-2 days ago. She is not blowing anything out of her nose. There is some associated chest congestion. The cough is productive of yellow mucous. She denies chest pain or shortness of breath. She denies fever, but has had chills and body aches. She has taken Nyquil with minimal relief. She has no history of allergies or breathing problems. She has had sick contacts.  Review of Systems      Past Medical History  Diagnosis Date  . Biliary colic   . Obesity   . Abnormal Pap smear 11/15/2009    ASCUS / colpo 12/15/2009 low grade squamous , CIN1     Family History  Problem Relation Age of Onset  . Depression Father   . Brain cancer Maternal Aunt   . Stroke Maternal Grandmother   . Heart disease Maternal Grandfather   . Dementia Paternal Grandmother   . Cancer Paternal Grandfather     lung    Social History   Social History  . Marital Status: Widowed    Spouse Name: N/A  . Number of Children: N/A  . Years of Education: N/A   Occupational History  . Not on file.   Social History Main Topics  . Smoking status: Never Smoker   . Smokeless tobacco: Never Used  . Alcohol Use: No  . Drug Use: No  . Sexual Activity: No   Other Topics Concern  . Not on file   Social History Narrative   Husband committed suicide in 2008.   Caring for her two children- ages 1715 and 5112.   Works at [employer].    No Known Allergies   Constitutional: Denies headache, fatigue, fever or abrupt weight changes.  HEENT:  Positive nasal congestion. Denies eye redness, eye pain, pressure behind the eyes, facial pain, ear pain, ringing in the ears, wax buildup, runny nose or sore throat. Respiratory: Positive cough. Denies difficulty breathing or shortness of breath.  Cardiovascular: Denies chest pain, chest tightness, palpitations or swelling in the hands or feet.   No other specific complaints in a complete review of  systems (except as listed in HPI above).  Objective:   BP 128/76 mmHg  Pulse 81  Temp(Src) 98.1 F (36.7 C) (Oral)  Wt 286 lb (129.729 kg)  SpO2 98%  LMP 05/15/2015  Wt Readings from Last 3 Encounters:  06/03/15 286 lb (129.729 kg)  12/08/14 294 lb (133.358 kg)  04/29/14 292 lb 12 oz (132.791 kg)     General: Appears her stated age, obese in NAD. HEENT: Head: normal shape and size, no sinus tenderness noted; Eyes: sclera white, no icterus, conjunctiva pink; Ears: Tm's gray and intact, normal light reflex; Nose: mucosa boggy and moist, septum midline; Throat/Mouth: + PND. Teeth present, mucosa erythematous and moist, no exudate noted, no lesions or ulcerations noted.  Neck: Cervical lymphadenopathy noted.  Cardiovascular: Normal rate and rhythm. S1,S2 noted.  No murmur, rubs or gallops noted.  Pulmonary/Chest: Normal effort and positive vesicular breath sounds. No respiratory distress. No wheezes, rales or ronchi noted.      Assessment & Plan:   Viral Upper Respiratory Infection:  Get some rest and drink plenty of water Do salt water gargles for the sore throat Start taking Flonase and Zyrtec OTC Rx for Azithromax x 5 days to start taking on Sunday if no improvement in symptoms   RTC as needed or if symptoms persist.

## 2015-06-03 NOTE — Progress Notes (Signed)
Pre visit review using our clinic review tool, if applicable. No additional management support is needed unless otherwise documented below in the visit note. 

## 2015-06-29 ENCOUNTER — Encounter (HOSPITAL_COMMUNITY)
Admission: RE | Admit: 2015-06-29 | Discharge: 2015-06-29 | Disposition: A | Discharge status: Home / Self Care | Payer: BLUE CROSS/BLUE SHIELD | Source: Ambulatory Visit | Attending: Surgery | Admitting: Surgery

## 2015-06-29 ENCOUNTER — Encounter (HOSPITAL_COMMUNITY): Payer: Self-pay

## 2015-06-29 DIAGNOSIS — Z01812 Encounter for preprocedural laboratory examination: Secondary | ICD-10-CM | POA: Insufficient documentation

## 2015-06-29 DIAGNOSIS — K8010 Calculus of gallbladder with chronic cholecystitis without obstruction: Secondary | ICD-10-CM | POA: Diagnosis not present

## 2015-06-29 LAB — CBC
HCT: 38.4 % (ref 36.0–46.0)
Hemoglobin: 12.3 g/dL (ref 12.0–15.0)
MCH: 26.5 pg (ref 26.0–34.0)
MCHC: 32 g/dL (ref 30.0–36.0)
MCV: 82.8 fL (ref 78.0–100.0)
Platelets: 332 10*3/uL (ref 150–400)
RBC: 4.64 MIL/uL (ref 3.87–5.11)
RDW: 15.3 % (ref 11.5–15.5)
WBC: 7.2 10*3/uL (ref 4.0–10.5)

## 2015-06-29 LAB — BASIC METABOLIC PANEL
Anion gap: 7 (ref 5–15)
BUN: 9 mg/dL (ref 6–20)
CO2: 26 mmol/L (ref 22–32)
Calcium: 9.2 mg/dL (ref 8.9–10.3)
Chloride: 105 mmol/L (ref 101–111)
Creatinine, Ser: 0.71 mg/dL (ref 0.44–1.00)
GFR calc Af Amer: >60 mL/min (ref >60)
GFR calc non Af Amer: >60 mL/min (ref >60)
Glucose, Bld: 87 mg/dL (ref 65–99)
Potassium: 4.1 mmol/L (ref 3.5–5.1)
Sodium: 138 mmol/L (ref 135–145)

## 2015-06-29 LAB — HCG, SERUM, QUALITATIVE: Preg, Serum: NEGATIVE

## 2015-06-29 NOTE — Pre-Procedure Instructions (Addendum)
Rhonda Santiago  06/29/2015      [id number] - [place], [place] - [address] [place] [address] Phone: [phone] Fax: [phone]  [facility] - [place], [place] - [address] [place] [address] Phone: [phone] Fax: [phone]    Your procedure is scheduled on 07/06/2015  Report to [facility] Admitting at 5:30 A.M.  Call this number if you have problems the morning of surgery:  [phone]   Remember:  Do not eat food or drink liquids after midnight.   On Monday               Stop IBUPROFEN   Take these medicines the morning of surgery with A SIP OF WATER :  NONE   Do not wear jewelry, make-up or nail polish.   Do not wear lotions, powders, or perfumes.  You may wear deodorant.   Do not shave 48 hours prior to surgery.     Do not bring valuables to the hospital.   [facility] is not responsible for any belongings or valuables.  Contacts, dentures or bridgework may not be worn into surgery.  Leave your suitcase in the car.  After surgery it may be brought to your room.  For patients admitted to the hospital, discharge time will be determined by your treatment team.  Patients discharged the day of surgery will not be allowed to drive home.   Name and phone number of your driver:   With family  Special instructions:  Special Instructions: [place] - Preparing for Surgery  Before surgery, you can play an important role.  Because skin is not sterile, your skin needs to be as free of germs as possible.  You can reduce the number of germs on you skin by washing with CHG (chlorahexidine gluconate) soap before surgery.  CHG is an antiseptic cleaner which kills germs and bonds with the skin to continue killing germs even after washing.  Please DO NOT use if you have an allergy to CHG or antibacterial soaps.  If your skin becomes reddened/irritated stop using the CHG and inform your nurse when you arrive at [facility]  Stay.  Do not shave (including legs and underarms) for at least 48 hours prior to the first CHG shower.  You may shave your face.  Please follow these instructions carefully:   1.  Shower with CHG Soap the night before surgery and the  morning of Surgery.  2.  If you choose to wash your hair, wash your hair first as usual with your  normal shampoo.  3.  After you shampoo, rinse your hair and body thoroughly to remove the  Shampoo.  4.  Use CHG as you would any other liquid soap.  You can apply chg directly to the skin and wash gently with scrungie or a clean washcloth.  5.  Apply the CHG Soap to your body ONLY FROM THE NECK DOWN.    Do not use on open wounds or open sores.  Avoid contact with your eyes, ears, mouth and genitals (private parts).  Wash genitals (private parts)   with your normal soap.  6.  Wash thoroughly, paying special attention to the area where your surgery will be performed.  7.  Thoroughly rinse your body with warm water from the neck down.  8.  DO NOT shower/wash with your normal soap after using and rinsing off   the CHG Soap.  9.  Pat yourself dry with a clean towel.            10.  Wear clean pajamas.            11.  Place clean sheets on your bed the night of your first shower and do not sleep with pets.  Day of Surgery  Do not apply any lotions/deodorants the morning of surgery.  Please wear clean clothes to the hospital/surgery center.  Please read over the following fact sheets that you were given. Pain Booklet, Coughing and Deep Breathing and Surgical Site Infection Prevention

## 2015-07-04 ENCOUNTER — Encounter (HOSPITAL_COMMUNITY): Payer: Self-pay | Admitting: Surgery

## 2015-07-04 DIAGNOSIS — K8010 Calculus of gallbladder with chronic cholecystitis without obstruction: Secondary | ICD-10-CM | POA: Diagnosis present

## 2015-07-04 NOTE — H&P (Signed)
  General Surgery [facility]  Rhonda Santiago DOB: Oct 24, 1970 Widowed / Language: [place] / Race: White Female  History of Present Illness  Patient words: gallbladder.  The patient is a 45 year old female who presents for evaluation of gall stones.  Patient returns to discuss laparoscopic cholecystectomy for symptomatic cholelithiasis. The patient was evaluated in August 2016. Due to a variety of circumstances she was unable to proceed with her surgery. She has had at least one significant attack of epigastric abdominal pain following a heavy meal since her last office visit. She would like to proceed with laparoscopic surgery in the new year.   Allergies No Known Drug Allergies08/06/2014  Medication History Multivitamins (Oral) Active. Medications Reconciled  Vitals  Weight: 286 lb Height: 70in Body Surface Area: 2.43 m Body Mass Index: 41.04 kg/m  Temp.: 97.31F(Temporal)  Pulse: 90 (Regular)  BP: 134/84 (Sitting, Left Arm, Standard)  Physical Exam  General - appears comfortable, no distress; not diaphorectic  HEENT - normocephalic; sclerae clear, gaze conjugate; mucous membranes moist, dentition good; voice normal  Neck - symmetric on extension; no palpable anterior or posterior cervical adenopathy; no palpable masses in the thyroid bed  Chest - clear bilaterally without rhonchi, rales, or wheeze  Cor - regular rhythm with normal rate; no significant murmur  Abd - soft without distension; right upper quadrant soft and nontender without palpable mass  Ext - non-tender without significant edema or lymphedema  Neuro - grossly intact; no tremor   Assessment & Plan   CHRONIC CHOLECYSTITIS WITH CALCULUS (K80.10)  Patient would like to proceed with laparoscopic cholecystectomy. She would like to do this in January following some international travel.  We again discussed laparoscopic cholecystectomy. We discussed the  possibility of an overnight stay following the procedure. We discussed her recovery and return to activities. Patient understands and would like to proceed in the near future.  The risks and benefits of the procedure have been discussed at length with the patient. The patient understands the proposed procedure, potential alternative treatments, and the course of recovery to be expected. All of the patient's questions have been answered at this time. The patient wishes to proceed with surgery.  Velora Heckler, MD, [facility] Office: [phone]

## 2015-07-05 MED ORDER — CEFAZOLIN SODIUM-DEXTROSE 2-3 GM-% IV SOLR
2.0000 g | INTRAVENOUS | Status: AC
  Administered 2015-07-06: 2 g via INTRAVENOUS
  Filled 2015-07-05: qty 50

## 2015-07-06 ENCOUNTER — Observation Stay (HOSPITAL_COMMUNITY)
Admission: RE | Admit: 2015-07-06 | Discharge: 2015-07-07 | Disposition: A | Discharge status: Home / Self Care | Payer: BLUE CROSS/BLUE SHIELD | Source: Ambulatory Visit | Attending: Surgery | Admitting: Surgery

## 2015-07-06 ENCOUNTER — Ambulatory Visit (HOSPITAL_COMMUNITY): Payer: BLUE CROSS/BLUE SHIELD | Admitting: Certified Registered Nurse Anesthetist

## 2015-07-06 ENCOUNTER — Encounter (HOSPITAL_COMMUNITY): Payer: Self-pay | Admitting: *Deleted

## 2015-07-06 ENCOUNTER — Encounter (HOSPITAL_COMMUNITY)
Admission: RE | Disposition: A | Discharge status: Home / Self Care | Payer: Self-pay | Source: Ambulatory Visit | Attending: Surgery

## 2015-07-06 ENCOUNTER — Ambulatory Visit (HOSPITAL_COMMUNITY): Payer: BLUE CROSS/BLUE SHIELD

## 2015-07-06 DIAGNOSIS — K8010 Calculus of gallbladder with chronic cholecystitis without obstruction: Principal | ICD-10-CM | POA: Diagnosis present

## 2015-07-06 DIAGNOSIS — R1013 Epigastric pain: Secondary | ICD-10-CM | POA: Diagnosis present

## 2015-07-06 DIAGNOSIS — Z419 Encounter for procedure for purposes other than remedying health state, unspecified: Secondary | ICD-10-CM

## 2015-07-06 HISTORY — PX: CHOLECYSTECTOMY: SHX55

## 2015-07-06 SURGERY — LAPAROSCOPIC CHOLECYSTECTOMY WITH INTRAOPERATIVE CHOLANGIOGRAM
Anesthesia: General | Site: Abdomen

## 2015-07-06 MED ORDER — OXYCODONE HCL 5 MG PO TABS: 5.0000 mg | ORAL_TABLET | Freq: Once | ORAL | Status: DC | PRN

## 2015-07-06 MED ORDER — LIDOCAINE HCL (CARDIAC) 20 MG/ML IV SOLN
INTRAVENOUS | Status: DC | PRN
  Administered 2015-07-06: 70 mg via INTRAVENOUS

## 2015-07-06 MED ORDER — PROMETHAZINE HCL 25 MG/ML IJ SOLN
6.2500 mg | INTRAMUSCULAR | Status: DC | PRN
  Administered 2015-07-06: 6.25 mg via INTRAVENOUS

## 2015-07-06 MED ORDER — NEOSTIGMINE METHYLSULFATE 10 MG/10ML IV SOLN
INTRAVENOUS | Status: AC
  Filled 2015-07-06: qty 5

## 2015-07-06 MED ORDER — HYDROCODONE-ACETAMINOPHEN 5-325 MG PO TABS: 1.0000 | ORAL_TABLET | ORAL | Status: DC | PRN

## 2015-07-06 MED ORDER — BUPIVACAINE-EPINEPHRINE (PF) 0.25% -1:200000 IJ SOLN
INTRAMUSCULAR | Status: AC
  Filled 2015-07-06: qty 30

## 2015-07-06 MED ORDER — SUCCINYLCHOLINE CHLORIDE 20 MG/ML IJ SOLN
INTRAMUSCULAR | Status: AC
  Filled 2015-07-06: qty 1

## 2015-07-06 MED ORDER — HEMOSTATIC AGENTS (NO CHARGE) OPTIME
TOPICAL | Status: DC | PRN
  Administered 2015-07-06: 1

## 2015-07-06 MED ORDER — ONDANSETRON HCL 4 MG/2ML IJ SOLN
4.0000 mg | Freq: Four times a day (QID) | INTRAMUSCULAR | Status: DC | PRN
  Administered 2015-07-06: 4 mg via INTRAVENOUS
  Filled 2015-07-06: qty 2

## 2015-07-06 MED ORDER — HYDROCODONE-ACETAMINOPHEN 5-325 MG PO TABS
1.0000 | ORAL_TABLET | ORAL | Status: DC | PRN
  Administered 2015-07-06: 1 via ORAL
  Administered 2015-07-07: 2 via ORAL
  Filled 2015-07-06: qty 1
  Filled 2015-07-06: qty 2

## 2015-07-06 MED ORDER — ACETAMINOPHEN 325 MG PO TABS: 650.0000 mg | ORAL_TABLET | Freq: Four times a day (QID) | ORAL | Status: DC | PRN

## 2015-07-06 MED ORDER — 0.9 % SODIUM CHLORIDE (POUR BTL) OPTIME
TOPICAL | Status: DC | PRN
  Administered 2015-07-06: 1000 mL

## 2015-07-06 MED ORDER — LIDOCAINE HCL (CARDIAC) 20 MG/ML IV SOLN
INTRAVENOUS | Status: AC
  Filled 2015-07-06: qty 5

## 2015-07-06 MED ORDER — OXYCODONE HCL 5 MG/5ML PO SOLN
5.0000 mg | Freq: Once | ORAL | Status: DC | PRN
Start: 2015-07-06 — End: 2015-07-06

## 2015-07-06 MED ORDER — ROCURONIUM BROMIDE 100 MG/10ML IV SOLN
INTRAVENOUS | Status: DC | PRN
  Administered 2015-07-06: 50 mg via INTRAVENOUS

## 2015-07-06 MED ORDER — ROCURONIUM BROMIDE 50 MG/5ML IV SOLN
INTRAVENOUS | Status: AC
  Filled 2015-07-06: qty 1

## 2015-07-06 MED ORDER — ONDANSETRON HCL 4 MG/2ML IJ SOLN
INTRAMUSCULAR | Status: AC
  Filled 2015-07-06: qty 2

## 2015-07-06 MED ORDER — DEXAMETHASONE SODIUM PHOSPHATE 4 MG/ML IJ SOLN
INTRAMUSCULAR | Status: AC
  Filled 2015-07-06: qty 2

## 2015-07-06 MED ORDER — DEXAMETHASONE SODIUM PHOSPHATE 4 MG/ML IJ SOLN
INTRAMUSCULAR | Status: DC | PRN
  Administered 2015-07-06: 8 mg via INTRAVENOUS

## 2015-07-06 MED ORDER — ACETAMINOPHEN 325 MG PO TABS: 325.0000 mg | ORAL_TABLET | ORAL | Status: DC | PRN

## 2015-07-06 MED ORDER — ONDANSETRON HCL 4 MG/2ML IJ SOLN
INTRAMUSCULAR | Status: DC | PRN
  Administered 2015-07-06: 4 mg via INTRAVENOUS

## 2015-07-06 MED ORDER — MIDAZOLAM HCL 2 MG/2ML IJ SOLN
INTRAMUSCULAR | Status: AC
  Filled 2015-07-06: qty 2

## 2015-07-06 MED ORDER — GLYCOPYRROLATE 0.2 MG/ML IJ SOLN
INTRAMUSCULAR | Status: DC | PRN
  Administered 2015-07-06: 0.4 mg via INTRAVENOUS

## 2015-07-06 MED ORDER — GLYCOPYRROLATE 0.2 MG/ML IJ SOLN
INTRAMUSCULAR | Status: AC
  Filled 2015-07-06: qty 1

## 2015-07-06 MED ORDER — HYDROMORPHONE HCL 1 MG/ML IJ SOLN: 1.0000 mg | INTRAMUSCULAR | Status: DC | PRN

## 2015-07-06 MED ORDER — FENTANYL CITRATE (PF) 250 MCG/5ML IJ SOLN
INTRAMUSCULAR | Status: AC
  Filled 2015-07-06: qty 5

## 2015-07-06 MED ORDER — FENTANYL CITRATE (PF) 100 MCG/2ML IJ SOLN: 25.0000 ug | INTRAMUSCULAR | Status: DC | PRN

## 2015-07-06 MED ORDER — MIDAZOLAM HCL 5 MG/5ML IJ SOLN
INTRAMUSCULAR | Status: DC | PRN
  Administered 2015-07-06: 2 mg via INTRAVENOUS

## 2015-07-06 MED ORDER — ONDANSETRON 4 MG PO TBDP: 4.0000 mg | ORAL_TABLET | Freq: Four times a day (QID) | ORAL | Status: DC | PRN

## 2015-07-06 MED ORDER — ACETAMINOPHEN 650 MG RE SUPP: 650.0000 mg | Freq: Four times a day (QID) | RECTAL | Status: DC | PRN

## 2015-07-06 MED ORDER — KCL IN DEXTROSE-NACL 20-5-0.45 MEQ/L-%-% IV SOLN
INTRAVENOUS | Status: DC
  Administered 2015-07-06 – 2015-07-07 (×2): via INTRAVENOUS
  Filled 2015-07-06 (×2): qty 1000

## 2015-07-06 MED ORDER — NEOSTIGMINE METHYLSULFATE 10 MG/10ML IV SOLN
INTRAVENOUS | Status: DC | PRN
  Administered 2015-07-06: 3 mg via INTRAVENOUS

## 2015-07-06 MED ORDER — PROMETHAZINE HCL 25 MG/ML IJ SOLN
INTRAMUSCULAR | Status: AC
  Filled 2015-07-06: qty 1

## 2015-07-06 MED ORDER — FENTANYL CITRATE (PF) 100 MCG/2ML IJ SOLN
INTRAMUSCULAR | Status: DC | PRN
  Administered 2015-07-06: 50 ug via INTRAVENOUS
  Administered 2015-07-06: 150 ug via INTRAVENOUS
  Administered 2015-07-06: 50 ug via INTRAVENOUS

## 2015-07-06 MED ORDER — PROPOFOL 10 MG/ML IV BOLUS
INTRAVENOUS | Status: DC | PRN
  Administered 2015-07-06: 140 mg via INTRAVENOUS

## 2015-07-06 MED ORDER — BUPIVACAINE-EPINEPHRINE 0.25% -1:200000 IJ SOLN
INTRAMUSCULAR | Status: DC | PRN
  Administered 2015-07-06: 20 mL

## 2015-07-06 MED ORDER — LACTATED RINGERS IV SOLN
INTRAVENOUS | Status: DC | PRN
  Administered 2015-07-06 (×2): via INTRAVENOUS

## 2015-07-06 MED ORDER — ACETAMINOPHEN 160 MG/5ML PO SOLN
325.0000 mg | ORAL | Status: DC | PRN
  Filled 2015-07-06: qty 20.3

## 2015-07-06 MED ORDER — PROPOFOL 10 MG/ML IV BOLUS
INTRAVENOUS | Status: AC
  Filled 2015-07-06: qty 20

## 2015-07-06 MED ORDER — GLYCOPYRROLATE 0.2 MG/ML IJ SOLN
INTRAMUSCULAR | Status: AC
  Filled 2015-07-06: qty 2

## 2015-07-06 MED ORDER — SODIUM CHLORIDE 0.9 % IV SOLN
INTRAVENOUS | Status: DC | PRN
  Administered 2015-07-06: 20 mL

## 2015-07-06 MED ORDER — SODIUM CHLORIDE 0.9 % IR SOLN
Status: DC | PRN
Start: 2015-07-06 — End: 2015-07-06
  Administered 2015-07-06: 1000 mL

## 2015-07-06 SURGICAL SUPPLY — 47 items
APPLIER CLIP ROT 10 11.4 M/L (STAPLE) ×2
BENZOIN TINCTURE PRP APPL 2/3 (GAUZE/BANDAGES/DRESSINGS) ×2 IMPLANT
BLADE SURG CLIPPER 3M 9600 (MISCELLANEOUS) IMPLANT
CANISTER SUCTION 2500CC (MISCELLANEOUS) ×2 IMPLANT
CHLORAPREP W/TINT 26ML (MISCELLANEOUS) ×2 IMPLANT
CLIP APPLIE ROT 10 11.4 M/L (STAPLE) ×1 IMPLANT
COVER MAYO STAND STRL (DRAPES) ×2 IMPLANT
COVER SURGICAL LIGHT HANDLE (MISCELLANEOUS) ×2 IMPLANT
DRAPE C-ARM 42X72 X-RAY (DRAPES) ×2 IMPLANT
DRAPE LAPAROSCOPIC ABDOMINAL (DRAPES) ×2 IMPLANT
ELECT REM PT RETURN 9FT ADLT (ELECTROSURGICAL) ×2
ELECTRODE REM PT RTRN 9FT ADLT (ELECTROSURGICAL) ×1 IMPLANT
GAUZE SPONGE 2X2 8PLY STRL LF (GAUZE/BANDAGES/DRESSINGS) ×1 IMPLANT
GLOVE BIO SURGEON STRL SZ7.5 (GLOVE) ×2 IMPLANT
GLOVE BIOGEL PI IND STRL 6.5 (GLOVE) ×2 IMPLANT
GLOVE BIOGEL PI IND STRL 7.5 (GLOVE) ×2 IMPLANT
GLOVE BIOGEL PI INDICATOR 6.5 (GLOVE) ×2
GLOVE BIOGEL PI INDICATOR 7.5 (GLOVE) ×2
GLOVE ECLIPSE 7.5 STRL STRAW (GLOVE) ×2 IMPLANT
GLOVE SURG ORTHO 8.0 STRL STRW (GLOVE) ×2 IMPLANT
GLOVE SURG SS PI 7.0 STRL IVOR (GLOVE) ×2 IMPLANT
GOWN STRL REUS W/ TWL LRG LVL3 (GOWN DISPOSABLE) ×4 IMPLANT
GOWN STRL REUS W/ TWL XL LVL3 (GOWN DISPOSABLE) ×1 IMPLANT
GOWN STRL REUS W/TWL LRG LVL3 (GOWN DISPOSABLE) ×4
GOWN STRL REUS W/TWL XL LVL3 (GOWN DISPOSABLE) ×1
HEMOSTAT SNOW SURGICEL 2X4 (HEMOSTASIS) ×2 IMPLANT
KIT BASIN OR (CUSTOM PROCEDURE TRAY) ×2 IMPLANT
KIT ROOM TURNOVER OR (KITS) ×2 IMPLANT
NS IRRIG 1000ML POUR BTL (IV SOLUTION) ×2 IMPLANT
PAD ARMBOARD 7.5X6 YLW CONV (MISCELLANEOUS) ×2 IMPLANT
POUCH SPECIMEN RETRIEVAL 10MM (ENDOMECHANICALS) IMPLANT
SCISSORS LAP 5X35 DISP (ENDOMECHANICALS) ×2 IMPLANT
SET CHOLANGIOGRAPH 5 50 .035 (SET/KITS/TRAYS/PACK) ×2 IMPLANT
SET IRRIG TUBING LAPAROSCOPIC (IRRIGATION / IRRIGATOR) ×2 IMPLANT
SLEEVE ENDOPATH XCEL 5M (ENDOMECHANICALS) ×2 IMPLANT
SPECIMEN JAR SMALL (MISCELLANEOUS) ×2 IMPLANT
SPONGE GAUZE 2X2 STER 10/PKG (GAUZE/BANDAGES/DRESSINGS) ×1
STRIP CLOSURE SKIN 1/2X4 (GAUZE/BANDAGES/DRESSINGS) ×2 IMPLANT
SUT MNCRL AB 4-0 PS2 18 (SUTURE) ×2 IMPLANT
TAPE CLOTH SURG 4X10 WHT LF (GAUZE/BANDAGES/DRESSINGS) ×2 IMPLANT
TOWEL OR 17X24 6PK STRL BLUE (TOWEL DISPOSABLE) ×2 IMPLANT
TOWEL OR 17X26 10 PK STRL BLUE (TOWEL DISPOSABLE) ×2 IMPLANT
TRAY LAPAROSCOPIC MC (CUSTOM PROCEDURE TRAY) ×2 IMPLANT
TROCAR XCEL BLUNT TIP 100MML (ENDOMECHANICALS) ×2 IMPLANT
TROCAR XCEL NON-BLD 11X100MML (ENDOMECHANICALS) ×2 IMPLANT
TROCAR XCEL NON-BLD 5MMX100MML (ENDOMECHANICALS) ×2 IMPLANT
TUBING INSUFFLATION (TUBING) ×2 IMPLANT

## 2015-07-06 NOTE — Anesthesia Preprocedure Evaluation (Addendum)
Anesthesia Evaluation  Patient identified by MRN, date of birth, ID band Patient awake    Reviewed: Allergy & Precautions, NPO status , Patient's Chart, lab work & pertinent test results  History of Anesthesia Complications Negative for: history of anesthetic complications  Airway Mallampati: II  TM Distance: >3 FB Neck ROM: Full    Dental  (+) Dental Advisory Given, Teeth Intact   Pulmonary neg pulmonary ROS,    breath sounds clear to auscultation       Cardiovascular negative cardio ROS   Rhythm:Regular     Neuro/Psych negative neurological ROS     GI/Hepatic Neg liver ROS, Cholecystitis    Endo/Other  Morbid obesity  Renal/GU negative Renal ROS     Musculoskeletal   Abdominal   Peds  Hematology negative hematology ROS (+)   Anesthesia Other Findings   Reproductive/Obstetrics                           Anesthesia Physical Anesthesia Plan  ASA: II  Anesthesia Plan: General   Post-op Pain Management:    Induction: Intravenous  Airway Management Planned: Oral ETT  Additional Equipment:   Intra-op Plan:   Post-operative Plan: Extubation in OR  Informed Consent: I have reviewed the patients History and Physical, chart, labs and discussed the procedure including the risks, benefits and alternatives for the proposed anesthesia with the patient or authorized representative who has indicated his/her understanding and acceptance.   Dental advisory given  Plan Discussed with: CRNA, Anesthesiologist and Surgeon  Anesthesia Plan Comments:         Anesthesia Quick Evaluation

## 2015-07-06 NOTE — Anesthesia Procedure Notes (Signed)
Procedure Name: Intubation Date/Time: 07/06/2015 7:42 AM Performed by: Rise Patience T Pre-anesthesia Checklist: Patient identified, Emergency Drugs available, Suction available and Patient being monitored Patient Re-evaluated:Patient Re-evaluated prior to inductionOxygen Delivery Method: Circle system utilized Preoxygenation: Pre-oxygenation with 100% oxygen Intubation Type: IV induction Ventilation: Mask ventilation without difficulty Laryngoscope Size: Mac and 3 Grade View: Grade II Tube type: Oral Tube size: 7.0 mm Number of attempts: 1 Airway Equipment and Method: Stylet Placement Confirmation: ETT inserted through vocal cords under direct vision,  positive ETCO2 and breath sounds checked- equal and bilateral Secured at: 22 cm Tube secured with: Tape Dental Injury: Teeth and Oropharynx as per pre-operative assessment  Comments: Intubation by Lolita Lenz, paramedic student. Intubation under direct supervision of CRNA and MD.

## 2015-07-06 NOTE — Op Note (Signed)
Procedure Note  Pre-operative Diagnosis:  Chronic cholecystitis, cholelithiasis  Post-operative Diagnosis:  same  Surgeon:  Velora Heckler, MD, FACS  Assistant:  Magnus Ivan, RNFA   Procedure:  Laparoscopic cholecystectomy with intra-operative cholangiography  Anesthesia:  General  Estimated Blood Loss:  minimal  Drains: none         Specimen: Gallbladder to pathology  Indications:  The patient is a 45 year old female who presents for evaluation of gall stones. Patient returns to discuss laparoscopic cholecystectomy for symptomatic cholelithiasis. The patient was evaluated in August 2016. Due to a variety of circumstances she was unable to proceed with her surgery. She has had at least one significant attack of epigastric abdominal pain following a heavy meal since her last office visit. She would like to proceed with laparoscopic surgery in the new year.  Procedure Details:  The patient was seen in the pre-op holding area. The risks, benefits, complications, treatment options, and expected outcomes were previously discussed with the patient. The patient agreed with the proposed plan and has signed the informed consent form.  The patient was brought to the Operating Room, identified as Rhonda Santiago and the procedure verified as laparoscopic cholecystectomy with intraoperative cholangiography. A "time out" was completed and the above information confirmed.  The patient was placed in the supine position. Following induction of general anesthesia, the abdomen was prepped and draped in the usual aseptic fashion.  An incision was made in the skin near the umbilicus. The midline fascia was incised and the peritoneal cavity was entered and a Hasson canula was introduced under direct vision.  The Hasson canula was secured with a 0-Vicryl pursestring suture. Pneumoperitoneum was established with carbon dioxide. Additional trocars were introduced under direct vision along the right  costal margin in the midline, mid-clavicular line, and anterior axillary line.   The gallbladder was identified and the fundus grasped and retracted cephalad. Adhesions were taken down bluntly and the electrocautery was utilized as needed, taking care not to injure any adjacent structures. The infundibulum was grasped and retracted laterally, exposing the peritoneum overlying the triangle of Calot. The peritoneum was incised and structures exposed with blunt dissection. The cystic duct was clearly identified, bluntly dissected circumferentially, and clipped at the neck of the gallbladder.  An incision was made in the cystic duct and the cholangiogram catheter introduced. The catheter was secured using an ligaclip.  Real-time cholangiography was performed using C-arm fluoroscopy.  There was rapid filling of a normal caliber common bile duct.  There was reflux of contrast into the left and right hepatic ductal systems.  There was free flow distally into the duodenum without filling defect or obstruction.  The catheter was removed from the peritoneal cavity.  The cystic duct was then ligated with surgical clips and divided. The cystic artery was identified, dissected circumferentially, ligated with ligaclips, and divided.  The gallbladder was dissected away from the liver bed using the electrocautery for hemostasis. The gallbladder was completely removed from the liver and placed into an endocatch bag. The gallbladder was removed in the endocatch bag through the umbilical port site and submitted to pathology for review.  The right upper quadrant was irrigated and the gallbladder bed was inspected. Hemostasis was achieved with the electrocautery.  Pneumoperitoneum was released after viewing removal of the trocars with good hemostasis noted. The umbilical wound was irrigated and the fascia was then closed with the pursestring suture.  Local anesthetic was infiltrated at all port sites. The skin incisions were  closed with 4-0 Monocril subcuticular sutures and steri-strips and dressings were applied.  Instrument, sponge, and needle counts were correct at the conclusion of the case.  The patient was awakened from anesthesia and brought to the recovery room in stable condition.  The patient tolerated the procedure well.   Velora Heckler, MD, [facility] Office: [phone]

## 2015-07-06 NOTE — Discharge Instructions (Signed)
°  [facility] ° °LAPAROSCOPIC SURGERY:  POST-OP INSTRUCTIONS ° °Always review your discharge instruction sheet given to you by the facility where your surgery was performed. ° °A prescription for pain medication may be given to you upon discharge.  Take your pain medication as prescribed.  If narcotic pain medicine is not needed, then you may take acetaminophen (Tylenol) or ibuprofen (Advil) as needed. ° °Take your usually prescribed medications unless otherwise directed. ° °If you need a refill on your pain medication, please contact your pharmacy.  They will contact our office to request authorization. Prescriptions will not be filled after 5 P.M. or on weekends. ° °You should follow a light diet the first few days after arrival home, such as soup and crackers or toast.  Be sure to include plenty of fluids daily. ° °Most patients will experience some swelling and bruising in the area of the incisions.  Ice packs will help.  Swelling and bruising can take several days to resolve.  ° °It is common to experience some constipation after surgery.  Increasing fluid intake and taking a stool softener (such as Colace) will usually help or prevent this problem from occurring.  A mild laxative (Milk of Magnesia or Miralax) should be taken according to package instructions if there has been no bowel movement after 48 hours. ° °You will have steri-strips and a gauze dressing over your incisions.  You may remove the gauze bandage on the second day after surgery, and you may shower at that time.  Leave your steri-strips (small skin tapes) in place directly over the incision.  These strips should remain on the skin for 5-7 days and then be removed.  You may get them wet in the shower and pat them dry. ° °Any sutures or staples will be removed at the office during your follow-up visit. ° °ACTIVITIES:  You may resume regular (light) daily activities beginning the next day - such as daily self-care, walking,  climbing stairs - gradually increasing activities as tolerated.  You may have sexual intercourse when it is comfortable.  Refrain from any heavy lifting or straining until approved by your doctor. ° °You may drive when you are no longer taking prescription pain medication, you can comfortably wear a seatbelt, and you can safely maneuver your car and apply brakes. ° °You should see your doctor in the office for a follow-up appointment approximately 2-3 weeks after your surgery.  Make sure that you call for this appointment within a day or two after you arrive home to insure a convenient appointment time. ° °WHEN TO CALL YOUR DOCTOR: °1. Fever over 101.0 °2. Inability to urinate °3. Continued bleeding from incision °4. Increased pain, redness, or drainage from the incision °5. Increasing abdominal pain ° °The clinic staff is available to answer your questions during regular business hours.  Please don’t hesitate to call and ask to speak to one of the nurses for clinical concerns.  If you have a medical emergency, go to the nearest emergency room or call 911.  A surgeon from [facility] is always on call for the hospital. ° °Neco Kling M. Rachard Isidro, MD, FACS °[facility] °Office: [phone] °Toll Free:  [phone] °FAX [phone] ° °Website: [email or web address] °

## 2015-07-06 NOTE — Progress Notes (Signed)
Rhonda Santiago is a 45 y.o. female patient admitted from ED awake, alert - oriented  X 4 - no acute distress noted.  VSS - Blood pressure 139/89, pulse 72, temperature 97.7 F (36.5 C), temperature source Oral, resp. rate 16, last menstrual period 06/21/2015, SpO2 97 %.    IV in place, occlusive dsg intact without redness.  Orientation to room, and floor completed with information packet given to patient/family.  Admission INP armband ID verified with patient/family, and in place.   SR up x 2, fall assessment complete, with patient and family able to verbalize understanding of risk associated with falls, and verbalized understanding to call nsg before up out of bed.  Call light within reach, patient able to voice, and demonstrate understanding.  Skin, clean-dry- intact without evidence of bruising, or skin tears.   No evidence of skin break down noted on exam.     Will cont to eval and treat per MD orders.  Alonza Bogus, RN 07/06/2015 10:17 AM

## 2015-07-06 NOTE — Interval H&P Note (Signed)
History and Physical Interval Note:  07/06/2015 7:02 AM  Rhonda Santiago  has presented today for surgery, with the diagnosis of Chronic chloleystitis,cholelithiasis.  The various methods of treatment have been discussed with the patient and family. After consideration of risks, benefits and other options for treatment, the patient has consented to    Procedure(s): LAPAROSCOPIC CHOLECYSTECTOMY WITH INTRAOPERATIVE CHOLANGIOGRAM (N/A) as a surgical intervention .    The patient's history has been reviewed, patient examined, no change in status, stable for surgery.  I have reviewed the patient's chart and labs.  Questions were answered to the patient's satisfaction.    Velora Heckler, MD, [facility] Office: [phone]    Chrystopher Stangl Judie Petit

## 2015-07-06 NOTE — Transfer of Care (Signed)
Immediate Anesthesia Transfer of Care Note  Patient: Rhonda Santiago  Procedure(s) Performed: Procedure(s): LAPAROSCOPIC CHOLECYSTECTOMY WITH INTRAOPERATIVE CHOLANGIOGRAM (N/A)  Patient Location: PACU  Anesthesia Type:General  Level of Consciousness: awake, alert  and oriented  Airway & Oxygen Therapy: Patient Spontanous Breathing  Post-op Assessment: Report given to RN, Post -op Vital signs reviewed and stable and Patient moving all extremities X 4  Post vital signs: Reviewed and stable  Last Vitals:  Filed Vitals:   07/06/15 0644  BP: 137/88  Pulse: 78  Temp: 36.4 C  Resp: 20    Complications: No apparent anesthesia complications

## 2015-07-07 ENCOUNTER — Encounter (HOSPITAL_COMMUNITY): Payer: Self-pay | Admitting: Surgery

## 2015-07-07 DIAGNOSIS — K8010 Calculus of gallbladder with chronic cholecystitis without obstruction: Secondary | ICD-10-CM | POA: Diagnosis not present

## 2015-07-07 MED ORDER — HYDROCODONE-ACETAMINOPHEN 5-325 MG PO TABS: 1.0000 | ORAL_TABLET | ORAL | Status: DC | PRN

## 2015-07-07 NOTE — Discharge Summary (Signed)
Physician Discharge Summary [facility]  Patient ID: Rhonda Santiago MRN: [id number] DOB/AGE: 1970-12-03 45 y.o.  Admit date: 07/06/2015 Discharge date: 07/07/2015  Admission Diagnoses:  Chronic cholecystitis, cholelithiais  Discharge Diagnoses:  Principal Problem:   Cholelithiasis with chronic cholecystitis Active Problems:   Abdominal pain, epigastric   Discharged Condition: good  Hospital Course: Patient was admitted for observation following gallbladder surgery.  Post op course was uncomplicated.  Pain was well controlled.  Tolerated diet.  Patient was prepared for discharge home on POD#1.  Consults: None  Treatments: surgery: lap chole with IOC  Discharge Exam: Blood pressure 116/78, pulse 67, temperature 97.6 F (36.4 C), temperature source Oral, resp. rate 16, height  (1.778 m), weight 128.187 kg (282 lb 9.6 oz), last menstrual period 06/21/2015, SpO2 96 %. HEENT - clear Neck - soft Chest - clear bilaterally Cor - RRR Abd - dressings dry and intact; mild tender  Disposition: Home  Discharge Instructions    Diet - low sodium heart healthy    Complete by:  As directed      Discharge instructions    Complete by:  As directed   [facility]  LAPAROSCOPIC SURGERY:  POST-OP INSTRUCTIONS  Always review your discharge instruction sheet given to you by the facility where your surgery was performed.  A prescription for pain medication may be given to you upon discharge.  Take your pain medication as prescribed.  If narcotic pain medicine is not needed, then you may take acetaminophen (Tylenol) or ibuprofen (Advil) as needed.  Take your usually prescribed medications unless otherwise directed.  If you need a refill on your pain medication, please contact your pharmacy.  They will contact our office to request authorization. Prescriptions will not be filled after 5 P.M. or on weekends.  You should follow a light diet the first  few days after arrival home, such as soup and crackers or toast.  Be sure to include plenty of fluids daily.  Most patients will experience some swelling and bruising in the area of the incisions.  Ice packs will help.  Swelling and bruising can take several days to resolve.   It is common to experience some constipation after surgery.  Increasing fluid intake and taking a stool softener (such as Colace) will usually help or prevent this problem from occurring.  A mild laxative (Milk of Magnesia or Miralax) should be taken according to package instructions if there has been no bowel movement after 48 hours.  You will have steri-strips and a gauze dressing over your incisions.  You may remove the gauze bandage on the second day after surgery, and you may shower at that time.  Leave your steri-strips (small skin tapes) in place directly over the incision.  These strips should remain on the skin for 5-7 days and then be removed.  You may get them wet in the shower and pat them dry.  Any sutures or staples will be removed at the office during your follow-up visit.  ACTIVITIES:  You may resume regular (light) daily activities beginning the next day - such as daily self-care, walking, climbing stairs - gradually increasing activities as tolerated.  You may have sexual intercourse when it is comfortable.  Refrain from any heavy lifting or straining until approved by your doctor.  You may drive when you are no longer taking prescription pain medication, you can comfortably wear a seatbelt, and you can safely maneuver your car and apply brakes.  You  should see your doctor in the office for a follow-up appointment approximately 2-3 weeks after your surgery.  Make sure that you call for this appointment within a day or two after you arrive home to insure a convenient appointment time.  WHEN TO CALL YOUR DOCTOR: Fever over 101.0 Inability to urinate Continued bleeding from incision Increased pain, redness,  or drainage from the incision Increasing abdominal pain  The clinic staff is available to answer your questions during regular business hours.  Please don't hesitate to call and ask to speak to one of the nurses for clinical concerns.  If you have a medical emergency, go to the nearest emergency room or call 911.  A surgeon from [facility] is always on call for the hospital.  Velora Heckler, MD, [facility] Office: [phone] Toll Free:  [phone] FAX [phone]  Website: [email or web address]     Increase activity slowly    Complete by:  As directed      Remove dressing in 24 hours    Complete by:  As directed             Medication List    TAKE these medications        calcium carbonate 750 MG chewable tablet  Commonly known as:  TUMS EX  Chew 1 tablet by mouth 2 (two) times daily as needed for heartburn.     HYDROcodone-acetaminophen 5-325 MG tablet  Commonly known as:  NORCO/VICODIN  Take 1-2 tablets by mouth every 4 (four) hours as needed for moderate pain.     HYDROcodone-acetaminophen 5-325 MG tablet  Commonly known as:  NORCO/VICODIN  Take 1-2 tablets by mouth every 4 (four) hours as needed for moderate pain.     ibuprofen 400 MG tablet  Commonly known as:  ADVIL,MOTRIN  Take 400 mg by mouth every 6 (six) hours as needed for mild pain.           Follow-up Information    Follow up with Velora Heckler, MD. Schedule an appointment as soon as possible for a visit in 3 weeks.   Specialty:  General Surgery   Why:  For wound re-check   Contact information:   [address] [place] [address] [phone]       Follow up with Velora Heckler, MD. Schedule an appointment as soon as possible for a visit in 3 weeks.   Specialty:  General Surgery   Why:  For wound re-check   Contact information:   [address] [place] [address] [phone]       Velora Heckler, MD,  [facility] Office: [phone]   Signed: Velora Heckler 07/07/2015, 7:51 AM

## 2015-07-07 NOTE — Anesthesia Postprocedure Evaluation (Signed)
Anesthesia Post Note  Patient: Rhonda Santiago  Procedure(s) Performed: Procedure(s) (LRB): LAPAROSCOPIC CHOLECYSTECTOMY WITH INTRAOPERATIVE CHOLANGIOGRAM (N/A)  Patient location during evaluation: PACU Anesthesia Type: General Level of consciousness: awake Pain management: pain level controlled Vital Signs Assessment: post-procedure vital signs reviewed and stable Respiratory status: spontaneous breathing Cardiovascular status: stable Postop Assessment: no signs of nausea or vomiting Anesthetic complications: no    Last Vitals:  Filed Vitals:   07/07/15 0510 07/07/15 1000  BP: 116/78 127/69  Pulse: 67 78  Temp: 36.4 C 36.8 C  Resp: 16 20    Last Pain:  Filed Vitals:   07/07/15 1016  PainSc: 5                  Glorianna Gott

## 2015-07-07 NOTE — Progress Notes (Signed)
Rhonda Santiago to be D/C'd  per MD order. Discussed with the patient and all questions fully answered.  VSS, Skin clean, dry and intact without evidence of skin break down, no evidence of skin tears noted.  IV catheter discontinued intact. Site without signs and symptoms of complications. Dressing and pressure applied.  An After Visit Summary was printed and given to the patient. Patient received prescription.  D/c education completed with patient/family including follow up instructions, medication list, d/c activities limitations if indicated, with other d/c instructions as indicated by MD - patient able to verbalize understanding, all questions fully answered.   Patient instructed to return to ED, call 911, or call MD for any changes in condition.   Patient to be escorted via WC, and D/C home via private auto.

## 2015-10-28 DIAGNOSIS — F4322 Adjustment disorder with anxiety: Secondary | ICD-10-CM | POA: Diagnosis not present

## 2016-01-31 ENCOUNTER — Other Ambulatory Visit: Payer: Self-pay | Admitting: Family Medicine

## 2016-02-24 DIAGNOSIS — Z01419 Encounter for gynecological examination (general) (routine) without abnormal findings: Secondary | ICD-10-CM | POA: Diagnosis not present

## 2016-02-24 DIAGNOSIS — Z6841 Body Mass Index (BMI) 40.0 and over, adult: Secondary | ICD-10-CM | POA: Diagnosis not present

## 2016-02-24 DIAGNOSIS — Z124 Encounter for screening for malignant neoplasm of cervix: Secondary | ICD-10-CM | POA: Diagnosis not present

## 2016-03-01 DIAGNOSIS — L578 Other skin changes due to chronic exposure to nonionizing radiation: Secondary | ICD-10-CM | POA: Diagnosis not present

## 2016-03-01 DIAGNOSIS — L720 Epidermal cyst: Secondary | ICD-10-CM | POA: Diagnosis not present

## 2016-03-01 DIAGNOSIS — B078 Other viral warts: Secondary | ICD-10-CM | POA: Diagnosis not present

## 2016-03-01 DIAGNOSIS — I781 Nevus, non-neoplastic: Secondary | ICD-10-CM | POA: Diagnosis not present

## 2016-03-13 DIAGNOSIS — Z1231 Encounter for screening mammogram for malignant neoplasm of breast: Secondary | ICD-10-CM | POA: Diagnosis not present

## 2016-06-01 DIAGNOSIS — F411 Generalized anxiety disorder: Secondary | ICD-10-CM | POA: Diagnosis not present

## 2016-07-03 DIAGNOSIS — F411 Generalized anxiety disorder: Secondary | ICD-10-CM | POA: Diagnosis not present

## 2016-08-25 ENCOUNTER — Encounter: Payer: Self-pay | Admitting: Medical

## 2016-08-25 ENCOUNTER — Ambulatory Visit: Payer: Self-pay | Admitting: Medical

## 2016-08-25 VITALS — BP 120/90 | HR 102 | Temp 98.5°F | Resp 16 | Ht 70.0 in | Wt 293.0 lb

## 2016-08-25 DIAGNOSIS — J019 Acute sinusitis, unspecified: Secondary | ICD-10-CM

## 2016-08-25 MED ORDER — AMOXICILLIN-POT CLAVULANATE 875-125 MG PO TABS: 1.0000 | ORAL_TABLET | Freq: Two times a day (BID) | ORAL | 0 refills | Status: DC

## 2016-08-25 NOTE — Progress Notes (Signed)
   Subjective:    Patient ID: Rhonda Santiago, female    DOB: [birth year], 46 y.o.   MRN: [id number]  HPI 46 yo female started with cough and congestion Monday or Tuesday. Productive yellow  cough this morning . Feels it has gotten into her chest, sometimes has congestion in nose , sometimes not. Treating with tussin -CF, zyrtec and mucinex DM this am.Emergen -C yesterday. Traveling this weekend and does not want to be out of town and sick.     Review of Systems  Constitutional: Negative for chills, fatigue and fever.  HENT: Positive for congestion, postnasal drip, rhinorrhea and sinus pressure. Negative for ear pain, sore throat and voice change.   Respiratory: Positive for cough and wheezing.   Cardiovascular: Negative.  Negative for chest pain, palpitations and leg swelling.  Neurological: Negative for dizziness, light-headedness and headaches.       Objective:   Physical Exam  Constitutional: She is oriented to person, place, and time. She appears well-developed and well-nourished.  HENT:  Head: Normocephalic and atraumatic.  Right Ear: Hearing, tympanic membrane, external ear and ear canal normal.  Left Ear: Hearing and external ear normal. A middle ear effusion is present.  Nose: Mucosal edema present.  Eyes: Conjunctivae, EOM and lids are normal. Pupils are equal, round, and reactive to light. Lids are everted and swept, no foreign bodies found.  Neck: Normal range of motion. Neck supple.  Cardiovascular: Normal rate and regular rhythm.  Exam reveals no gallop and no friction rub.   No murmur heard. Pulmonary/Chest: Effort normal and breath sounds normal.  Musculoskeletal: Normal range of motion.  Neurological: She is alert and oriented to person, place, and time.  Skin: Skin is warm and dry.  Psychiatric: She has a normal mood and affect. Her behavior is normal.  Nursing note and vitals reviewed.  Nares, swollen turbinates, erythema and yellow discharge noted. Clearing  throat in room a lot, mild cough noted.       Assessment & Plan:  Sinusitis with some upper respiratory infection aspect. Prescribed Augmentin 875mg  one by mouth twice daily x 10 days take with food.0 rx Use otc zyrtec or claritin and flonase for post nasal drip and nasal congestion. Return to clinic in  3-5 days if not improving.

## 2016-09-12 DIAGNOSIS — F411 Generalized anxiety disorder: Secondary | ICD-10-CM | POA: Diagnosis not present

## 2016-10-18 ENCOUNTER — Ambulatory Visit (INDEPENDENT_AMBULATORY_CARE_PROVIDER_SITE_OTHER): Payer: BLUE CROSS/BLUE SHIELD | Admitting: Nurse Practitioner

## 2016-10-18 ENCOUNTER — Encounter: Payer: Self-pay | Admitting: Nurse Practitioner

## 2016-10-18 VITALS — BP 120/78 | HR 91 | Temp 97.9°F | Ht 70.0 in | Wt 296.0 lb

## 2016-10-18 DIAGNOSIS — K645 Perianal venous thrombosis: Secondary | ICD-10-CM

## 2016-10-18 MED ORDER — HYDROCORTISONE ACETATE 25 MG RE SUPP: 25.0000 mg | Freq: Two times a day (BID) | RECTAL | 1 refills | Status: DC

## 2016-10-18 NOTE — Progress Notes (Signed)
Subjective:  Patient ID: Rhonda Santiago, female    DOB: 1971-05-10  Age: 46 y.o. MRN: [id number]  CC: Hemorrhoids (hemrroid/itching--has it for years but this time it is bothering her)   Other  This is a recurrent problem. The current episode started 1 to 4 weeks ago. The problem occurs intermittently. The problem has been waxing and waning. Pertinent negatives include no anorexia, change in bowel habit, chills, fatigue, fever, nausea or urinary symptoms. Associated symptoms comments: No rectal bleeding or discharge.  No pain with BM. Nothing aggravates the symptoms. She has tried rest (preparation H) for the symptoms. The treatment provided no relief.    Outpatient Medications Prior to Visit  Medication Sig Dispense Refill  . calcium carbonate (TUMS EX) 750 MG chewable tablet Chew 1 tablet by mouth 2 (two) times daily as needed for heartburn.    Marland Kitchen HYDROcodone-acetaminophen (NORCO/VICODIN) 5-325 MG tablet Take 1-2 tablets by mouth every 4 (four) hours as needed for moderate pain. 20 tablet 0  . HYDROcodone-acetaminophen (NORCO/VICODIN) 5-325 MG tablet Take 1-2 tablets by mouth every 4 (four) hours as needed for moderate pain. 20 tablet 0  . ibuprofen (ADVIL,MOTRIN) 400 MG tablet Take 400 mg by mouth every 6 (six) hours as needed for mild pain.    Marland Kitchen amoxicillin-clavulanate (AUGMENTIN) 875-125 MG tablet Take 1 tablet by mouth 2 (two) times daily. Take with food (Patient not taking: Reported on 10/18/2016) 20 tablet 0  . tretinoin (RETIN-A) 0.025 % cream Apply topically.     No facility-administered medications prior to visit.     ROS See HPI  Objective:  BP 120/78   Pulse 91   Temp 97.9 F (36.6 C)   Ht 5\' 10"  (1.778 m)   Wt 296 lb (134.3 kg)   SpO2 99%   BMI 42.47 kg/m   BP Readings from Last 3 Encounters:  10/18/16 120/78  08/25/16 120/90  07/07/15 127/69    Wt Readings from Last 3 Encounters:  10/18/16 296 lb (134.3 kg)  08/25/16 293 lb (132.9 kg)  07/06/15 282 lb  9.6 oz (128.2 kg)    Physical Exam  Abdominal: Soft. She exhibits no distension. There is no tenderness.  Genitourinary: Rectal exam shows external hemorrhoid and fissure. Rectal exam shows no tenderness.  Vitals reviewed.   Lab Results  Component Value Date   WBC 7.2 06/29/2015   HGB 12.3 06/29/2015   HCT 38.4 06/29/2015   PLT 332 06/29/2015   GLUCOSE 87 06/29/2015   CHOL 190 04/14/2014   TRIG 91.0 04/14/2014   HDL 45.60 04/14/2014   LDLDIRECT 137.9 08/01/2011   LDLCALC 126 (H) 04/14/2014   ALT 14 12/08/2014   AST 17 12/08/2014   NA 138 06/29/2015   K 4.1 06/29/2015   CL 105 06/29/2015   CREATININE 0.71 06/29/2015   BUN 9 06/29/2015   CO2 26 06/29/2015   TSH 1.07 04/14/2014    No results found.  Assessment & Plan:   Shelonda was seen today for hemorrhoids.  Diagnoses and all orders for this visit:  Thrombosed external hemorrhoid -     Discontinue: hydrocortisone (ANUSOL-HC) 25 MG suppository; Place 1 suppository (25 mg total) rectally 2 (two) times daily.   I am having Ms. Hankey maintain her ibuprofen, calcium carbonate, HYDROcodone-acetaminophen, HYDROcodone-acetaminophen, tretinoin, and amoxicillin-clavulanate.  Meds ordered this encounter  Medications  . DISCONTD: hydrocortisone (ANUSOL-HC) 25 MG suppository    Sig: Place 1 suppository (25 mg total) rectally 2 (two) times daily.    Dispense:  12 suppository    Refill:  1    Order Specific Question:   Supervising Provider    Answer:   Tresa GarterPLOTNIKOV, ALEKSEI V [[id number]]    Follow-up: Return if symptoms worsen or fail to improve.  Alysia Pennaharlotte Brooke Payes, NP

## 2016-10-18 NOTE — Patient Instructions (Addendum)
Change Anusol suppository to protosol cream due to insurance coverage  How to Take a [employer] Industries[employer]itz Bath A sitz bath is a warm water bath that is taken while you are sitting down. The water should only come up to your hips and should cover your buttocks. Your health care provider may recommend a sitz bath to help you:  Clean the lower part of your body, including your genital area.  With itching.  With pain.  With sore muscles or muscles that tighten or spasm. How to take a sitz bath Take 3-4 sitz baths per day or as told by your health care provider. 1. Partially fill a bathtub with warm water. You will only need the water to be deep enough to cover your hips and buttocks when you are sitting in it. 2. If your health care provider told you to put medicine in the water, follow the directions exactly. 3. Sit in the water and open the tub drain a little. 4. Turn on the warm water again to keep the tub at the correct level. Keep the water running constantly. 5. Soak in the water for 15-20 minutes or as told by your health care provider. 6. After the sitz bath, pat the affected area dry first. Do not rub it. 7. Be careful when you stand up after the sitz bath because you may feel dizzy. Contact a health care provider if:  Your symptoms get worse. Do not continue with sitz baths if your symptoms get worse.  You have new symptoms. Do not continue with sitz baths until you talk with your health care provider. This information is not intended to replace advice given to you by your health care provider. Make sure you discuss any questions you have with your health care provider. Document Released: 02/12/2004 Document Revised: 10/20/2015 Document Reviewed: 05/20/2014 Elsevier Interactive Patient Education  2017 [employer]

## 2016-10-19 ENCOUNTER — Telehealth: Payer: Self-pay | Admitting: Family Medicine

## 2016-10-19 NOTE — Telephone Encounter (Signed)
PA stared for Hydrocortisone suppository, Key: [id number]  Pt aware and will notify pt once we got the response.

## 2016-10-19 NOTE — Telephone Encounter (Signed)
Pt states her insurance will not cover hydrocortisone (ANUSOL-HC) 25 MG suppository. Would like to know what her other options are so she can find out the cost.   For this medication for 12 pills it is $70 dollars, she will pay out of pocket for it depending on the cost of her other options.  Please advisse

## 2016-10-20 MED ORDER — HYDROCORTISONE 2.5 % RE CREA: 1.0000 "application " | TOPICAL_CREAM | Freq: Two times a day (BID) | RECTAL | 1 refills | Status: DC

## 2016-10-20 NOTE — Telephone Encounter (Signed)
PA is denied--change to cream per [place], rx sent   Pt is aware.

## 2016-10-20 NOTE — Addendum Note (Signed)
Addended byLivingston Diones: Naria Abbey on: 10/20/2016 11:08 AM   Modules accepted: Orders

## 2016-10-20 NOTE — Telephone Encounter (Signed)
Patient has called back.  Patient was to take these for 6 days.  Patient has been buying each day.  Rather than wait on a PA to be processed could something else be sent to patients pharmacy [facility] in [place] that may be covered since she is looking at only a 6 day period.  Patient states cost is too much for her to wait on PA to be processed. Please call patient at [phone].

## 2016-10-20 NOTE — Telephone Encounter (Signed)
Spoke with pharamcy, rx changed and sent.

## 2016-11-12 IMAGING — US US ABDOMEN LIMITED
1 series · 14 of 25 positions shown · non-contrast
Comparison: None.

CLINICAL DATA: Right upper quadrant pain

EXAM:
US ABDOMEN LIMITED - RIGHT UPPER QUADRANT

[Series 1: us abdomen limited · 0.21mm/px · 14 of 55 slices shown]
[im 1/55]
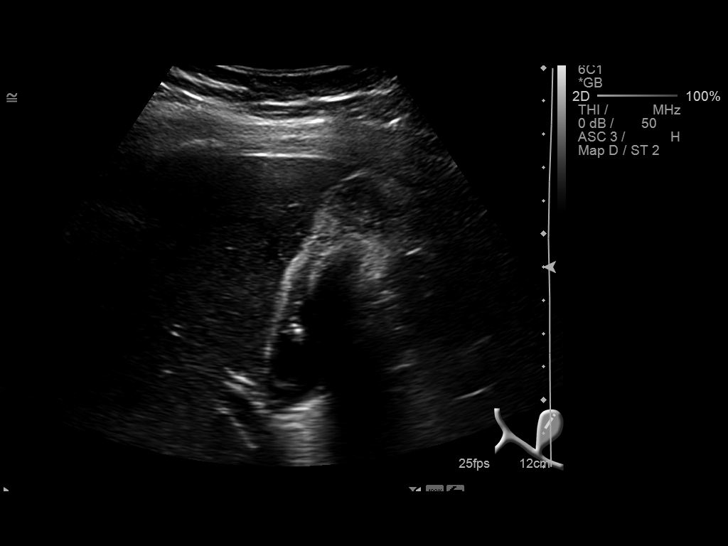
[im 5/55]
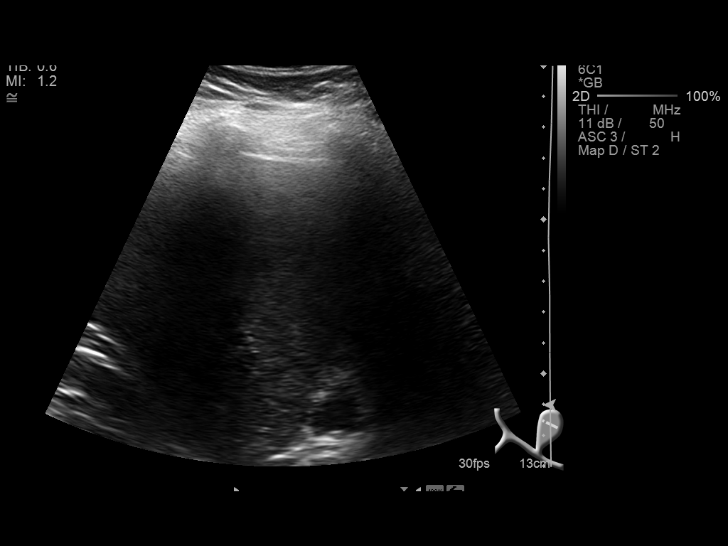
[im 10/55]
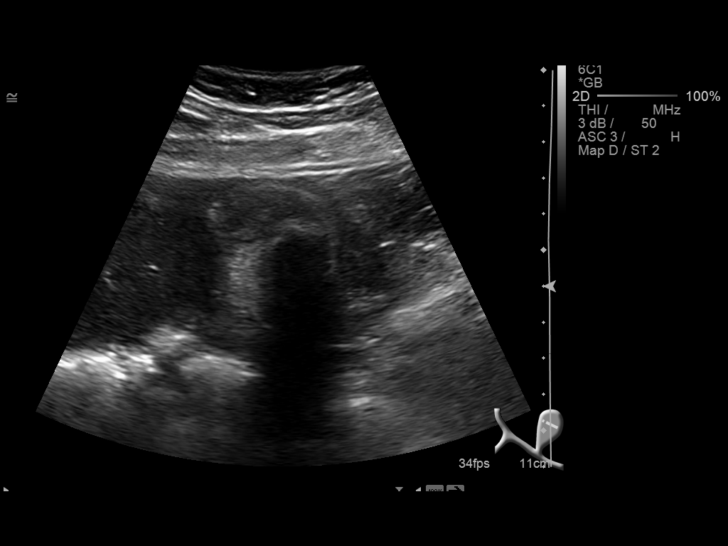
[im 14/55]
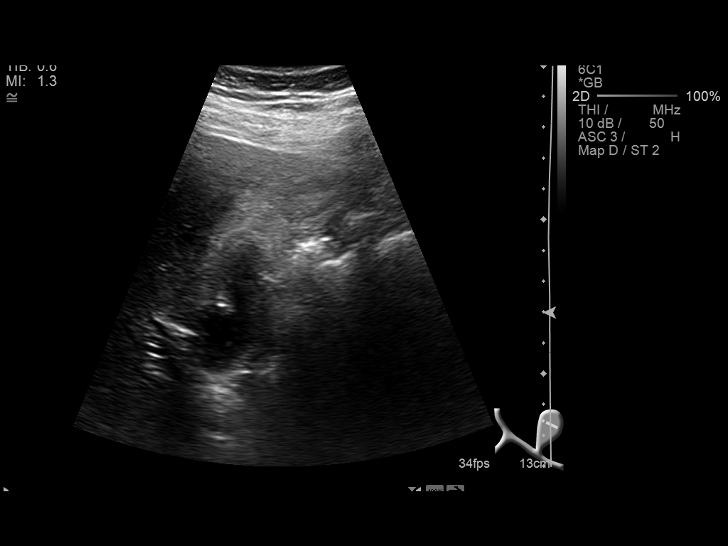
[im 19/55]
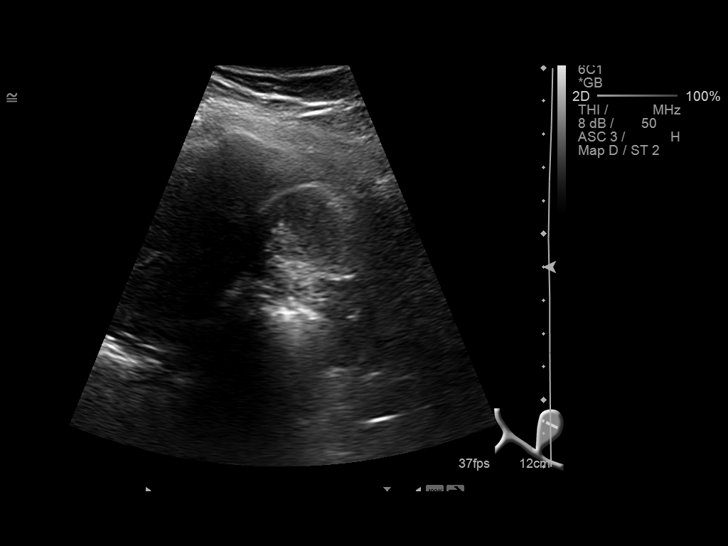
[im 21/55]
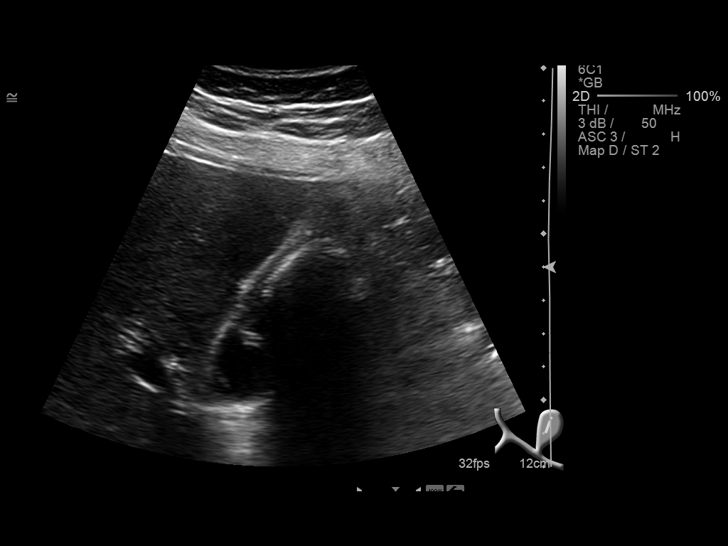
[im 25/55]
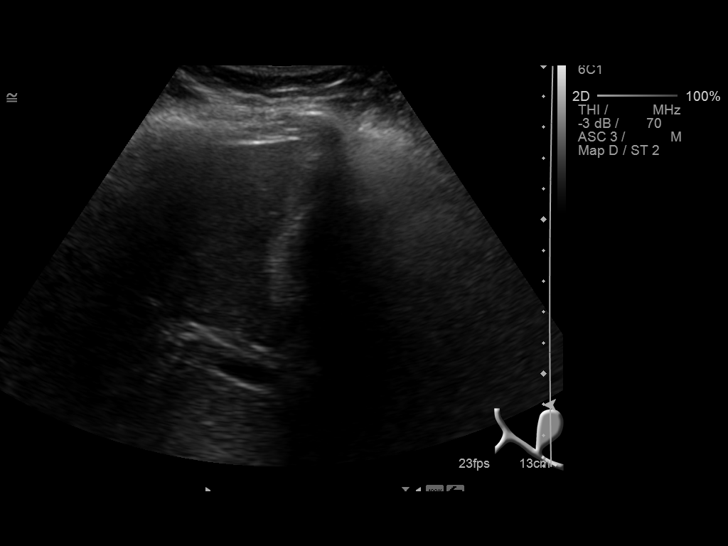
[im 30/55]
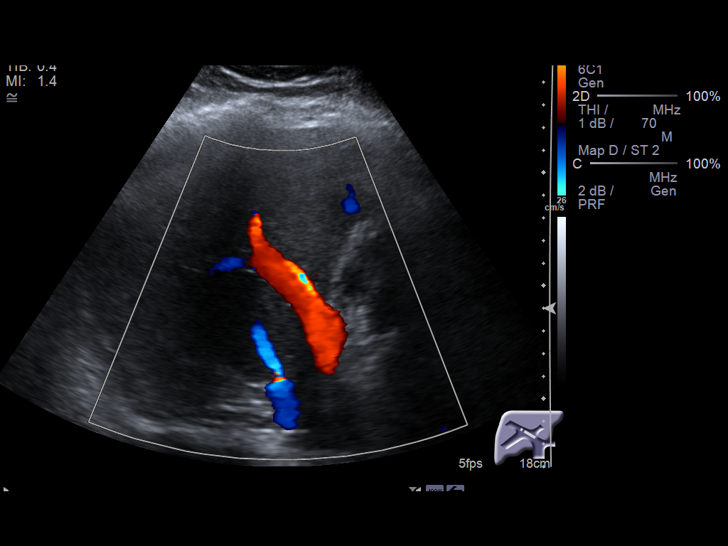
[im 34/55]
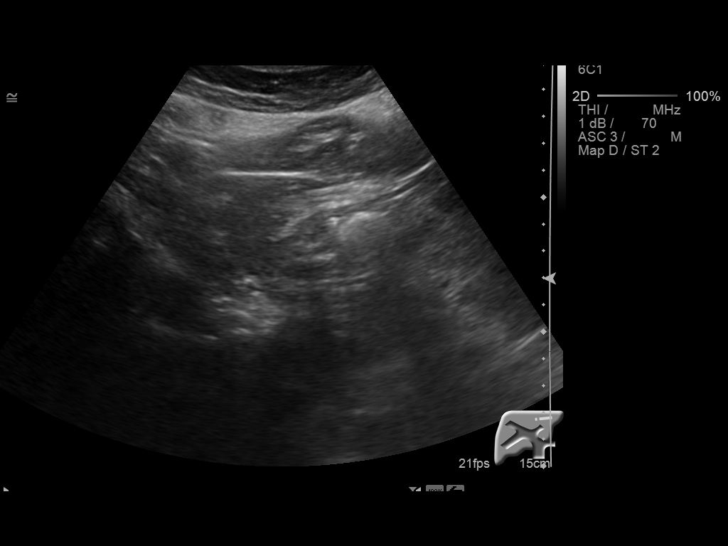
[im 37/55]
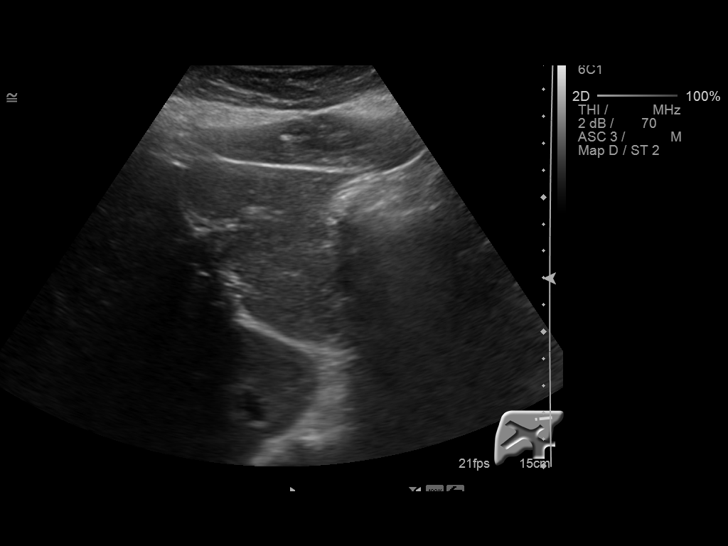
[im 41/55]
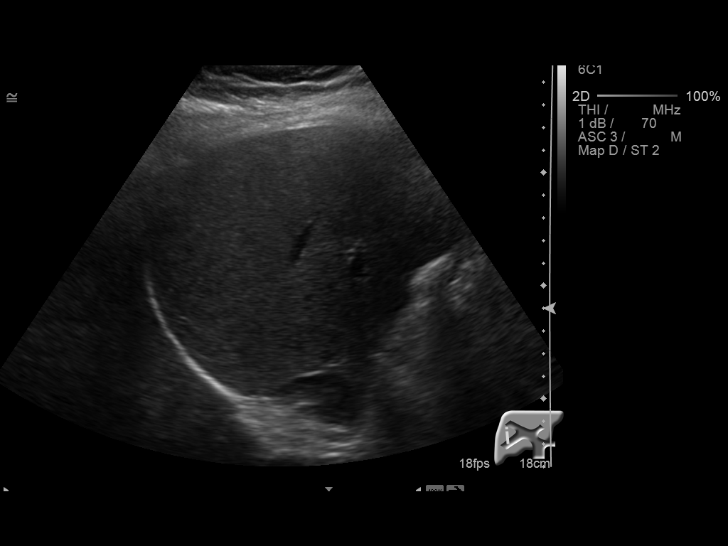
[im 46/55]
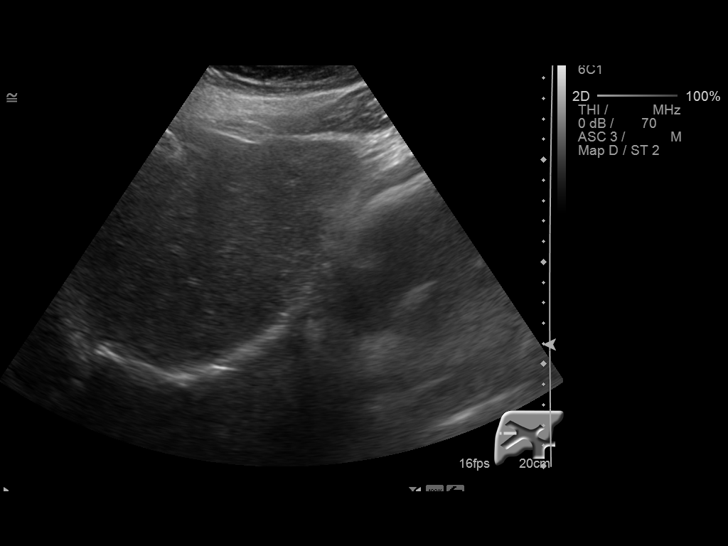
[im 50/55]
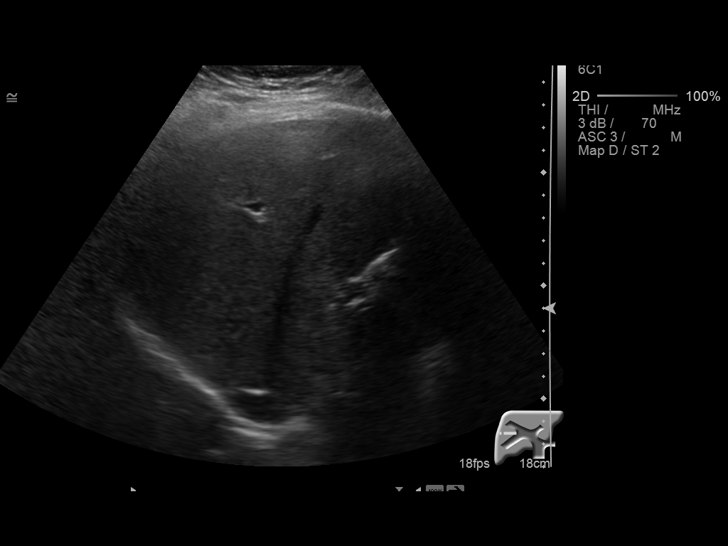
[im 55/55]
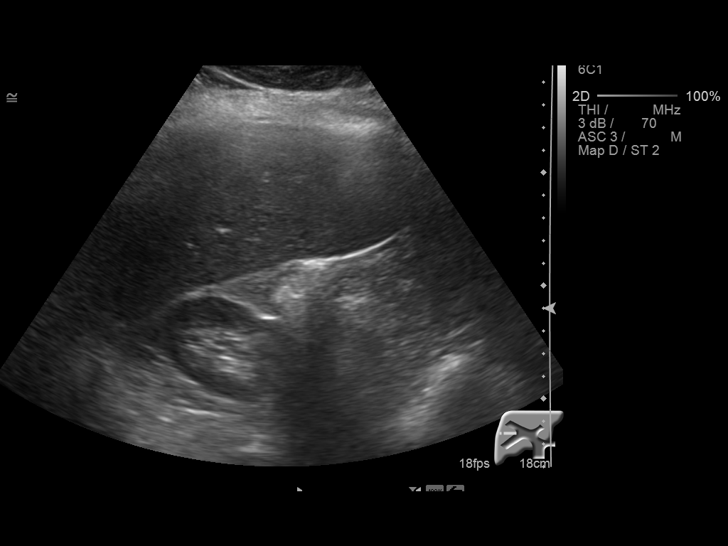

[14 of 25 positions shown; findings below may reference images not displayed]

FINDINGS: Gallbladder:

Multiple gallstones are present. The largest measures 2.9 cm. The
fundus of the gallbladder has an abnormal appearance. The wall is
markedly thickened and the regular. Alternatively, this may
represent sludge with septations. For either case, chronic or
subacute inflammation cannot be excluded.

Common bile duct:

Diameter: 3 mm

Liver:

No focal lesion identified. Within normal limits in parenchymal
echogenicity.
IMPRESSION: Cholelithiasis.

The fundus of the gallbladder is abnormal. This finding either
represents sludge with septations suggesting chronic or subacute
inflammation, or significant irregular wall thickening. Wall
thickening can be seen with inflammatory disease or malignancy. If
there are signs and symptoms related to acute cholecystitis, nuclear
medicine imaging would be recommended to further evaluate. If there
are no signs of inflammatory disease, short-term follow-up study
within 3 months is recommended to ensure resolution, as malignancy
is not excluded.

## 2017-01-23 DIAGNOSIS — F411 Generalized anxiety disorder: Secondary | ICD-10-CM | POA: Diagnosis not present

## 2017-03-15 DIAGNOSIS — Z1231 Encounter for screening mammogram for malignant neoplasm of breast: Secondary | ICD-10-CM | POA: Diagnosis not present

## 2017-03-15 DIAGNOSIS — Z01419 Encounter for gynecological examination (general) (routine) without abnormal findings: Secondary | ICD-10-CM | POA: Diagnosis not present

## 2017-03-15 DIAGNOSIS — N870 Mild cervical dysplasia: Secondary | ICD-10-CM | POA: Diagnosis not present

## 2017-03-21 DIAGNOSIS — F411 Generalized anxiety disorder: Secondary | ICD-10-CM | POA: Diagnosis not present

## 2017-05-11 DIAGNOSIS — D239 Other benign neoplasm of skin, unspecified: Secondary | ICD-10-CM | POA: Diagnosis not present

## 2017-05-11 DIAGNOSIS — L910 Hypertrophic scar: Secondary | ICD-10-CM | POA: Diagnosis not present

## 2017-05-11 DIAGNOSIS — L720 Epidermal cyst: Secondary | ICD-10-CM | POA: Diagnosis not present

## 2017-05-11 DIAGNOSIS — D1801 Hemangioma of skin and subcutaneous tissue: Secondary | ICD-10-CM | POA: Diagnosis not present

## 2017-05-22 DIAGNOSIS — F411 Generalized anxiety disorder: Secondary | ICD-10-CM | POA: Diagnosis not present

## 2017-05-25 ENCOUNTER — Encounter: Payer: Self-pay | Admitting: Adult Health

## 2017-05-25 ENCOUNTER — Ambulatory Visit: Payer: Self-pay | Admitting: Adult Health

## 2017-05-25 VITALS — BP 130/82 | HR 88 | Temp 98.8°F | Resp 16 | Ht 70.0 in | Wt 294.0 lb

## 2017-05-25 DIAGNOSIS — J069 Acute upper respiratory infection, unspecified: Secondary | ICD-10-CM

## 2017-05-25 DIAGNOSIS — H6501 Acute serous otitis media, right ear: Secondary | ICD-10-CM

## 2017-05-25 MED ORDER — AMOXICILLIN-POT CLAVULANATE 875-125 MG PO TABS: 1.0000 | ORAL_TABLET | Freq: Two times a day (BID) | ORAL | 0 refills | Status: DC

## 2017-05-25 NOTE — Patient Instructions (Signed)
Upper Respiratory Infection, Adult Most upper respiratory infections (URIs) are caused by a virus. A URI affects the nose, throat, and upper air passages. The most common type of URI is often called "the common cold." Follow these instructions at home:  Take medicines only as told by your doctor.  Gargle warm saltwater or take cough drops to comfort your throat as told by your doctor.  Use a warm mist humidifier or inhale steam from a shower to increase air moisture. This may make it easier to breathe.  Drink enough fluid to keep your pee (urine) clear or pale yellow.  Eat soups and other clear broths.  Have a healthy diet.  Rest as needed.  Go back to work when your fever is gone or your doctor says it is okay. ? You may need to stay home longer to avoid giving your URI to others. ? You can also wear a face mask and wash your hands often to prevent spread of the virus.  Use your inhaler more if you have asthma.  Do not use any tobacco products, including cigarettes, chewing tobacco, or electronic cigarettes. If you need help quitting, ask your doctor. Contact a doctor if:  You are getting worse, not better.  Your symptoms are not helped by medicine.  You have chills.  You are getting more short of breath.  You have brown or red mucus.  You have yellow or brown discharge from your nose.  You have pain in your face, especially when you bend forward.  You have a fever.  You have puffy (swollen) neck glands.  You have pain while swallowing.  You have white areas in the back of your throat. Get help right away if:  You have very bad or constant: ? Headache. ? Ear pain. ? Pain in your forehead, behind your eyes, and over your cheekbones (sinus pain). ? Chest pain.  You have long-lasting (chronic) lung disease and any of the following: ? Wheezing. ? Long-lasting cough. ? Coughing up blood. ? A change in your usual mucus.  You have a stiff neck.  You have  changes in your: ? Vision. ? Hearing. ? Thinking. ? Mood. This information is not intended to replace advice given to you by your health care provider. Make sure you discuss any questions you have with your health care provider. Document Released: 11/08/2007 Document Revised: 01/23/2016 Document Reviewed: 08/27/2013 Elsevier Interactive Patient Education  2018 [employer]. Otitis Media, Adult Otitis media is redness, soreness, and puffiness (swelling) in the space just behind your eardrum (middle ear). It may be caused by allergies or infection. It often happens along with a cold. Follow these instructions at home:  Take your medicine as told. Finish it even if you start to feel better.  Only take over-the-counter or prescription medicines for pain, discomfort, or fever as told by your doctor.  Follow up with your doctor as told. Contact a doctor if:  You have otitis media only in one ear, or bleeding from your nose, or both.  You notice a lump on your neck.  You are not getting better in 3-5 days.  You feel worse instead of better. Get help right away if:  You have pain that is not helped with medicine.  You have puffiness, redness, or pain around your ear.  You get a stiff neck.  You cannot move part of your face (paralysis).  You notice that the bone behind your ear hurts when you touch it. This information is   not intended to replace advice given to you by your health care provider. Make sure you discuss any questions you have with your health care provider. Document Released: 11/08/2007 Document Revised: 10/28/2015 Document Reviewed: 12/17/2012 Elsevier Interactive Patient Education  2017 Elsevier Inc.  

## 2017-05-25 NOTE — Progress Notes (Addendum)
Subjective:     Patient ID: Rhonda Santiago, female   DOB: 12-Aug-1970, 46 y.o.   MRN: [id number]  HPI   Patient is a 46 year old female in no acute distress who presents to the clinic with symptoms of sinus drainage for the past couple of months- she has not been taking Zyrtec.   Sore throat started last night and increased tiredness with  coughing up yellow / green nasal drainage.   Blood pressure 130/82, pulse 88, temperature 98.8 F (37.1 C), resp. rate 16, height 5\' 10"  (1.778 m), weight 294 lb (133.4 kg), last menstrual period 04/26/2017, SpO2 98 %.   Not sexually active she reports  - Patient's last menstrual period was 04/26/2017.- widow    No Known Allergies  Dianne DunAron, Talia M, MD   Patient Active Problem List   Diagnosis Date Noted  . Cholelithiasis with chronic cholecystitis 07/04/2015  . Abdominal pain, epigastric 12/08/2014  . Chest pain 04/14/2014  . Obesity 04/14/2014  . Morbid obesity (HCC) 01/13/2014  . Biliary colic   . Cervical intraepithelial neoplasia grade 1 06/05/2006      Review of Systems  Constitutional: Positive for fatigue (started last two days. ). Negative for activity change, appetite change, chills and diaphoresis.  HENT: Positive for ear pain (pressure in ears bilaterally ), postnasal drip, sinus pressure and sore throat.   Respiratory: Negative.   Cardiovascular: Negative.   Gastrointestinal: Positive for nausea.  Genitourinary: Negative.   Musculoskeletal: Negative.   Skin: Negative.   Allergic/Immunologic: Negative.   Neurological: Negative.   Psychiatric/Behavioral: Negative.        Objective:   Physical Exam  Constitutional: She is oriented to person, place, and time. She appears well-developed and well-nourished. No distress.  HENT:  Head: Normocephalic and atraumatic.  Right Ear: Hearing and ear canal normal. There is tenderness (mild ). No foreign bodies. Tympanic membrane is erythematous. Tympanic membrane is not perforated. A  middle ear effusion is present.  Left Ear: Hearing, tympanic membrane, external ear and ear canal normal. Tympanic membrane is not perforated.  Nose: Mucosal edema and rhinorrhea present. Right sinus exhibits no maxillary sinus tenderness and no frontal sinus tenderness. Left sinus exhibits no maxillary sinus tenderness and no frontal sinus tenderness.  Mouth/Throat: Uvula is midline, oropharynx is clear and moist and mucous membranes are normal. No oropharyngeal exudate, posterior oropharyngeal edema, posterior oropharyngeal erythema or tonsillar abscesses.  Eyes: Conjunctivae, EOM and lids are normal. Pupils are equal, round, and reactive to light.  Neck: Trachea normal, normal range of motion, full passive range of motion without pain and phonation normal. Neck supple. Normal carotid pulses, no hepatojugular reflux and no JVD present. No tracheal tenderness, no spinous process tenderness and no muscular tenderness present. Carotid bruit is not present. No neck rigidity. No tracheal deviation, no edema, no erythema and normal range of motion present. No Brudzinski's sign noted.  Cardiovascular: Normal rate, regular rhythm, normal heart sounds and intact distal pulses. Exam reveals no gallop and no friction rub.  No murmur heard. Pulmonary/Chest: Effort normal and breath sounds normal. No stridor. No respiratory distress. She has no wheezes. She has no rales. She exhibits no tenderness.  Abdominal: Soft. Bowel sounds are normal. She exhibits no distension and no mass. There is no tenderness. There is no rebound and no guarding.  Musculoskeletal: Normal range of motion. She exhibits no edema, tenderness or deformity.  Lymphadenopathy:    She has no cervical adenopathy.  Neurological: She is alert and oriented  to person, place, and time. She has normal reflexes. No cranial nerve deficit. She exhibits normal muscle tone. Coordination normal.  Skin: Skin is warm and dry. No rash noted. She is not  diaphoretic. No erythema. No pallor.  Psychiatric: She has a normal mood and affect. Her behavior is normal. Judgment and thought content normal.  Nursing note and vitals reviewed.      Assessment:     Right acute serous otitis media, recurrence not specified  Upper respiratory tract infection, unspecified type      Plan:     Right otitis media     Meds ordered this encounter  Medications  . amoxicillin-clavulanate (AUGMENTIN) 875-125 MG tablet    Sig: Take 1 tablet by mouth 2 (two) times daily.    Dispense:  20 tablet    Refill:  0   Advised to return to clinic for an appointment if no improvement within 72 hours or if any symptoms change or worsen. Advised ER or urgent Care if after hours or on weekend. 911 for emergency symptoms at any time.   Patient verbalized understanding of all instructions given and denies any further questions at this time.

## 2017-06-07 DIAGNOSIS — F411 Generalized anxiety disorder: Secondary | ICD-10-CM | POA: Diagnosis not present

## 2017-06-20 DIAGNOSIS — F411 Generalized anxiety disorder: Secondary | ICD-10-CM | POA: Diagnosis not present

## 2017-07-24 ENCOUNTER — Encounter: Payer: Self-pay | Admitting: Medical

## 2017-07-24 ENCOUNTER — Ambulatory Visit: Payer: Self-pay | Admitting: Medical

## 2017-07-24 VITALS — BP 122/61 | HR 86 | Temp 97.0°F | Resp 18 | Ht 70.0 in | Wt 295.0 lb

## 2017-07-24 DIAGNOSIS — J0100 Acute maxillary sinusitis, unspecified: Secondary | ICD-10-CM

## 2017-07-24 DIAGNOSIS — J069 Acute upper respiratory infection, unspecified: Secondary | ICD-10-CM

## 2017-07-24 MED ORDER — AMOXICILLIN-POT CLAVULANATE 875-125 MG PO TABS: 1.0000 | ORAL_TABLET | Freq: Two times a day (BID) | ORAL | 0 refills | Status: DC

## 2017-07-24 NOTE — Progress Notes (Signed)
   Subjective:    Patient ID: Rhonda Santiago, female    DOB: 07-24-1970, 47 y.o.   MRN: [id number]  HPI 47 yo  Female in non acute distress. Started last Wednesday, cough productive green nasal discharge yellow . Feels like she had fever in the beginning of the illness. Denies Shortness of breath or chest pain.    Review of Systems  Constitutional: Negative for activity change, appetite change, chills, fatigue and fever.  HENT: Positive for congestion, postnasal drip, rhinorrhea, sinus pressure, sinus pain, sneezing, sore throat and trouble swallowing (scratchy). Negative for ear pain.   Respiratory: Positive for cough and wheezing (slightly). Negative for chest tightness and shortness of breath.   Cardiovascular: Negative for chest pain.  Gastrointestinal: Negative for abdominal pain.  Genitourinary: Negative for dysuria.  Musculoskeletal: Negative for myalgias.  Skin: Negative for rash.  Allergic/Immunologic: Positive for environmental allergies (seasonal fall and spring).  Neurological: Negative for dizziness, syncope and light-headedness.  Hematological: Positive for adenopathy.  Psychiatric/Behavioral: Negative for behavioral problems, self-injury and suicidal ideas. The patient is not nervous/anxious.        Objective:   Physical Exam  Constitutional: She is oriented to person, place, and time. She appears well-developed and well-nourished.  HENT:  Head: Normocephalic and atraumatic.  Right Ear: Hearing, external ear and ear canal normal. A middle ear effusion is present.  Left Ear: Hearing, external ear and ear canal normal. A middle ear effusion is present.  Nose: Mucosal edema and rhinorrhea present.  Mouth/Throat: Uvula is midline. Posterior oropharyngeal edema present. No oropharyngeal exudate or posterior oropharyngeal erythema.  Eyes: Conjunctivae and EOM are normal. Pupils are equal, round, and reactive to light.  Neck: Normal range of motion. Neck supple.   Cardiovascular: Normal rate, regular rhythm and normal heart sounds.  Pulmonary/Chest: Effort normal and breath sounds normal.  Musculoskeletal: Normal range of motion.  Lymphadenopathy:    She has cervical adenopathy.  Neurological: She is alert and oriented to person, place, and time.  Skin: Skin is warm and dry.  Psychiatric: She has a normal mood and affect. Her behavior is normal. Judgment and thought content normal.  Nursing note and vitals reviewed.  Mild swelling of Uvula. Erythema and turbinate swelling on the left side.      Assessment & Plan:  Sinusitis Upper Respiratory Infection, Eustachian tube dysfunction bilaterally. Meds ordered this encounter  Medications  . amoxicillin-clavulanate (AUGMENTIN) 875-125 MG tablet    Sig: Take 1 tablet by mouth 2 (two) times daily.    Dispense:  20 tablet    Refill:  0  Return in 3-5 days if not improving. Rest and increase fluids.  OTC Flonase NS take as directed. Patient verbalizes understanding and has now questions at discharge.

## 2017-07-24 NOTE — Patient Instructions (Signed)
Eustachian Tube Dysfunction The eustachian tube connects the middle ear to the back of the nose. It regulates air pressure in the middle ear by allowing air to move between the ear and nose. It also helps to drain fluid from the middle ear space. When the eustachian tube does not function properly, air pressure, fluid, or both can build up in the middle ear. Eustachian tube dysfunction can affect one or both ears. What are the causes? This condition happens when the eustachian tube becomes blocked or cannot open normally. This may result from:  Ear infections.  Colds and other upper respiratory infections.  Allergies.  Irritation, such as from cigarette smoke or acid from the stomach coming up into the esophagus (gastroesophageal reflux).  Sudden changes in air pressure, such as from descending in an airplane.  Abnormal growths in the nose or throat, such as nasal polyps, tumors, or enlarged tissue at the back of the throat (adenoids).  What increases the risk? This condition may be more likely to develop in people who smoke and people who are overweight. Eustachian tube dysfunction may also be more likely to develop in children, especially children who have:  Certain birth defects of the mouth, such as cleft palate.  Large tonsils and adenoids.  What are the signs or symptoms? Symptoms of this condition may include:  A feeling of fullness in the ear.  Ear pain.  Clicking or popping noises in the ear.  Ringing in the ear.  Hearing loss.  Loss of balance.  Symptoms may get worse when the air pressure around you changes, such as when you travel to an area of high elevation or fly on an airplane. How is this diagnosed? This condition may be diagnosed based on:  Your symptoms.  A physical exam of your ear, nose, and throat.  Tests, such as those that measure: ? The movement of your eardrum (tympanogram). ? Your hearing (audiometry).  How is this treated? Treatment  depends on the cause and severity of your condition. If your symptoms are mild, you may be able to relieve your symptoms by moving air into ("popping") your ears. If you have symptoms of fluid in your ears, treatment may include:  Decongestants.  Antihistamines.  Nasal sprays or ear drops that contain medicines that reduce swelling (steroids).  In some cases, you may need to have a procedure to drain the fluid in your eardrum (myringotomy). In this procedure, a small tube is placed in the eardrum to:  Drain the fluid.  Restore the air in the middle ear space.  Follow these instructions at home:  Take over-the-counter and prescription medicines only as told by your health care provider.  Use techniques to help pop your ears as recommended by your health care provider. These may include: ? Chewing gum. ? Yawning. ? Frequent, forceful swallowing. ? Closing your mouth, holding your nose closed, and gently blowing as if you are trying to blow air out of your nose.  Do not do any of the following until your health care provider approves: ? Travel to high altitudes. ? Fly in airplanes. ? Work in a pressurized cabin or room. ? Scuba dive.  Keep your ears dry. Dry your ears completely after showering or bathing.  Do not smoke.  Keep all follow-up visits as told by your health care provider. This is important. Contact a health care provider if:  Your symptoms do not go away after treatment.  Your symptoms come back after treatment.  You are   unable to pop your ears.  You have: ? A fever. ? Pain in your ear. ? Pain in your head or neck. ? Fluid draining from your ear.  Your hearing suddenly changes.  You become very dizzy.  You lose your balance. This information is not intended to replace advice given to you by your health care provider. Make sure you discuss any questions you have with your health care provider. Document Released: 06/18/2015 Document Revised: 10/28/2015  Document Reviewed: 06/10/2014 Elsevier Interactive Patient Education  2018 [employer] Inc. Upper Respiratory Infection, Adult Most upper respiratory infections (URIs) are caused by a virus. A URI affects the nose, throat, and upper air passages. The most common type of URI is often called "the common cold." Follow these instructions at home:  Take medicines only as told by your doctor.  Gargle warm saltwater or take cough drops to comfort your throat as told by your doctor.  Use a warm mist humidifier or inhale steam from a shower to increase air moisture. This may make it easier to breathe.  Drink enough fluid to keep your pee (urine) clear or pale yellow.  Eat soups and other clear broths.  Have a healthy diet.  Rest as needed.  Go back to work when your fever is gone or your doctor says it is okay. ? You may need to stay home longer to avoid giving your URI to others. ? You can also wear a face mask and wash your hands often to prevent spread of the virus.  Use your inhaler more if you have asthma.  Do not use any tobacco products, including cigarettes, chewing tobacco, or electronic cigarettes. If you need help quitting, ask your doctor. Contact a doctor if:  You are getting worse, not better.  Your symptoms are not helped by medicine.  You have chills.  You are getting more short of breath.  You have brown or red mucus.  You have yellow or brown discharge from your nose.  You have pain in your face, especially when you bend forward.  You have a fever.  You have puffy (swollen) neck glands.  You have pain while swallowing.  You have white areas in the back of your throat. Get help right away if:  You have very bad or constant: ? Headache. ? Ear pain. ? Pain in your forehead, behind your eyes, and over your cheekbones (sinus pain). ? Chest pain.  You have long-lasting (chronic) lung disease and any of the following: ? Wheezing. ? Long-lasting  cough. ? Coughing up blood. ? A change in your usual mucus.  You have a stiff neck.  You have changes in your: ? Vision. ? Hearing. ? Thinking. ? Mood. This information is not intended to replace advice given to you by your health care provider. Make sure you discuss any questions you have with your health care provider. Document Released: 11/08/2007 Document Revised: 01/23/2016 Document Reviewed: 08/27/2013 Elsevier Interactive Patient Education  2018 [employer]. Sinusitis, Adult Sinusitis is soreness and inflammation of your sinuses. Sinuses are hollow spaces in the bones around your face. They are located:  Around your eyes.  In the middle of your forehead.  Behind your nose.  In your cheekbones.  Your sinuses and nasal passages are lined with a stringy fluid (mucus). Mucus normally drains out of your sinuses. When your nasal tissues get inflamed or swollen, the mucus can get trapped or blocked so air cannot flow through your sinuses. This lets bacteria, viruses, and funguses  grow, and that leads to infection. Follow these instructions at home: Medicines  Take, use, or apply over-the-counter and prescription medicines only as told by your doctor. These may include nasal sprays.  If you were prescribed an antibiotic medicine, take it as told by your doctor. Do not stop taking the antibiotic even if you start to feel better. Hydrate and Humidify  Drink enough water to keep your pee (urine) clear or pale yellow.  Use a cool mist humidifier to keep the humidity level in your home above 50%.  Breathe in steam for 10-15 minutes, 3-4 times a day or as told by your doctor. You can do this in the bathroom while a hot shower is running.  Try not to spend time in cool or dry air. Rest  Rest as much as possible.  Sleep with your head raised (elevated).  Make sure to get enough sleep each night. General instructions  Put a warm, moist washcloth on your face 3-4 times a day  or as told by your doctor. This will help with discomfort.  Wash your hands often with soap and water. If there is no soap and water, use hand sanitizer.  Do not smoke. Avoid being around people who are smoking (secondhand smoke).  Keep all follow-up visits as told by your doctor. This is important. Contact a doctor if:  You have a fever.  Your symptoms get worse.  Your symptoms do not get better within 10 days. Get help right away if:  You have a very bad headache.  You cannot stop throwing up (vomiting).  You have pain or swelling around your face or eyes.  You have trouble seeing.  You feel confused.  Your neck is stiff.  You have trouble breathing. This information is not intended to replace advice given to you by your health care provider. Make sure you discuss any questions you have with your health care provider. Document Released: 11/08/2007 Document Revised: 01/16/2016 Document Reviewed: 03/17/2015 Elsevier Interactive Patient Education  [employer]2018 [employer] Inc.

## 2017-08-13 ENCOUNTER — Encounter: Payer: Self-pay | Admitting: Medical

## 2017-08-13 ENCOUNTER — Ambulatory Visit: Payer: Self-pay | Admitting: Medical

## 2017-08-13 VITALS — BP 114/67 | HR 83 | Temp 98.1°F | Resp 18 | Ht 70.0 in | Wt 293.0 lb

## 2017-08-13 DIAGNOSIS — J301 Allergic rhinitis due to pollen: Secondary | ICD-10-CM

## 2017-08-13 DIAGNOSIS — J029 Acute pharyngitis, unspecified: Secondary | ICD-10-CM

## 2017-08-13 DIAGNOSIS — R0982 Postnasal drip: Secondary | ICD-10-CM

## 2017-08-13 LAB — POCT RAPID STREP A (OFFICE): Rapid Strep A Screen: NEGATIVE

## 2017-08-13 NOTE — Progress Notes (Signed)
   Subjective:    Patient ID: Rhonda Santiago, female    DOB: 19-Sep-1970, 47 y.o.   MRN: [id number]  HPI  47 yo non acute distress female with complaints of sore throat , nasal congestion and post nasal drip starting yesterday. Coughing with drainage. Small amount of yellow came out of her mouth. Sore thraot  Taking Zyrtec daily but forgot it today. Had a little wheeze with mucus in her throat. She would like a strep test. It is noted that plants are now Blooming)."I have been sneezing a lot too"  Review of Systems  Constitutional: Negative for chills and fever.  HENT: Positive for congestion and sneezing. Negative for ear pain (feels pressure), sinus pressure, sinus pain and sore throat.   Respiratory: Positive for cough. Negative for chest tightness and shortness of breath.   Cardiovascular: Negative for chest pain.  Gastrointestinal: Negative for abdominal pain.  Genitourinary: Negative for dysuria.  Musculoskeletal: Negative for myalgias.  Skin: Negative for rash.  Neurological: Positive for headaches (some from sinus pressure). Negative for dizziness, syncope and light-headedness.  Hematological: Positive for adenopathy.  Psychiatric/Behavioral: Negative for behavioral problems, self-injury and suicidal ideas. The patient is not nervous/anxious.        Objective:   Physical Exam  Constitutional: She is oriented to person, place, and time. She appears well-developed and well-nourished.  HENT:  Head: Normocephalic and atraumatic.  Right Ear: External ear normal.  Left Ear: External ear normal.  Eyes: Conjunctivae and EOM are normal. Pupils are equal, round, and reactive to light.  Neck: Normal range of motion. Neck supple.  Cardiovascular: Normal rate, regular rhythm and normal heart sounds.  Pulmonary/Chest: Effort normal and breath sounds normal.  Lymphadenopathy:    She has cervical adenopathy (mild submandibular).  Neurological: She is alert and oriented to person, place,  and time.  Skin: Skin is warm and dry.  Psychiatric: She has a normal mood and affect. Her behavior is normal. Judgment and thought content normal.  Nursing note and vitals reviewed.  Swollen uvula pinker on the edges of the posterior pharnyx Clearing throat in room a lot.  Strep test Negative    Assessment & Plan:  Allergic Rhinitis/ PND/ pharyngitis Dilute salt water gargles, [place] spray to rinse out pollen from nose. increase fluids. OTC Flonase take as directed and continue OTC Zyrtec. OTC Motrin or Tylenol prn pain take as directed.. Return to the clinic as needed. Patient verbalizes understanding and has no questions at discharge.

## 2017-08-13 NOTE — Patient Instructions (Signed)

## 2017-10-16 IMAGING — RF DG CHOLANGIOGRAM OPERATIVE
1 series · 8 of 8 positions shown · non-contrast
Comparison: Ultrasound 12/09/2014

CLINICAL DATA: 44-year-old female with a history of cholelithiasis.

EXAM:
INTRAOPERATIVE CHOLANGIOGRAM
TECHNIQUE: Cholangiographic images from the C-arm fluoroscopic device were
submitted for interpretation post-operatively. Please see the
procedural report for the amount of contrast and the fluoroscopy
time utilized.

[Series 1: run · 2 acquisitions, 8 frames shown]
[im 1/2]
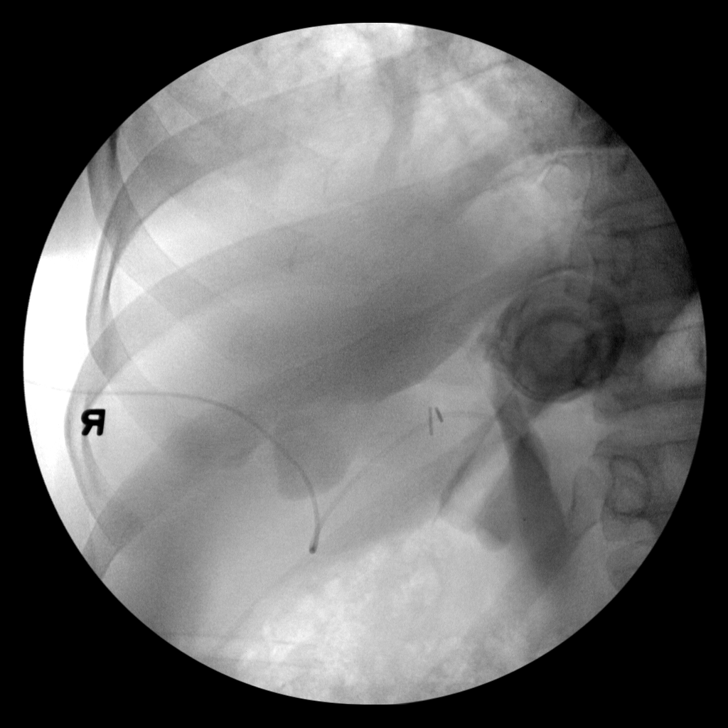
[im 1/2]
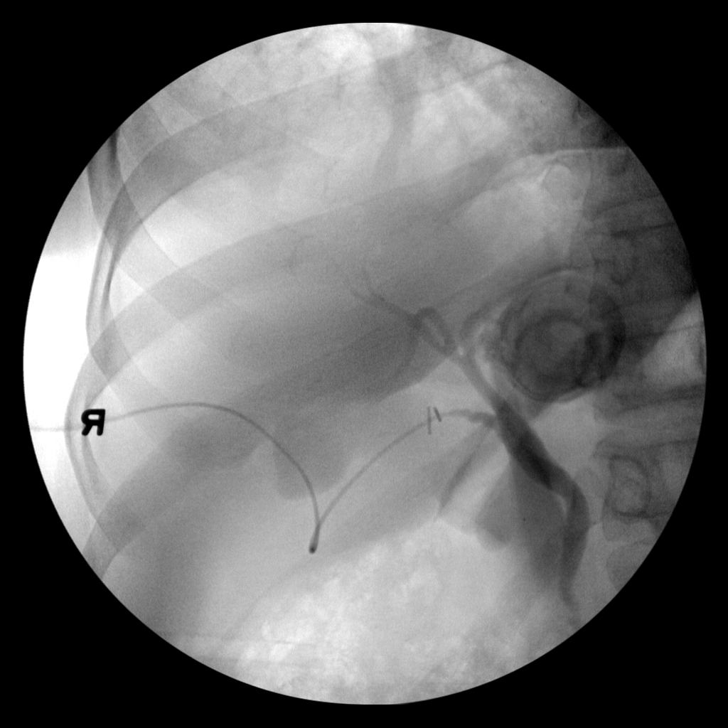
[im 1/2]
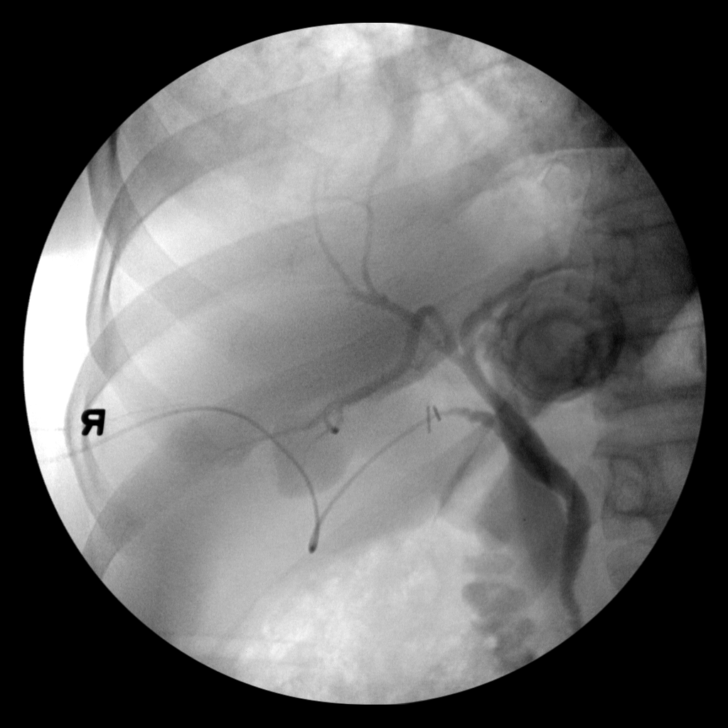
[im 1/2]
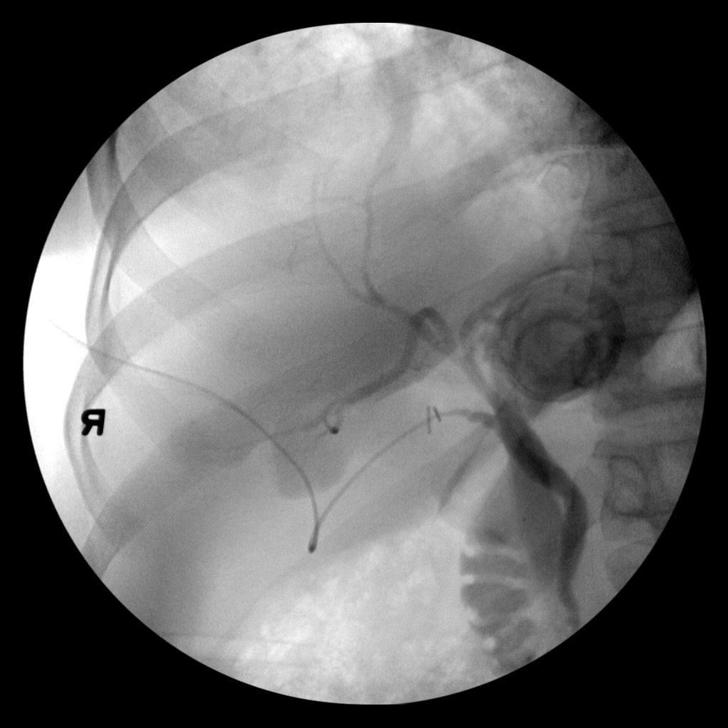
[im 2/2]
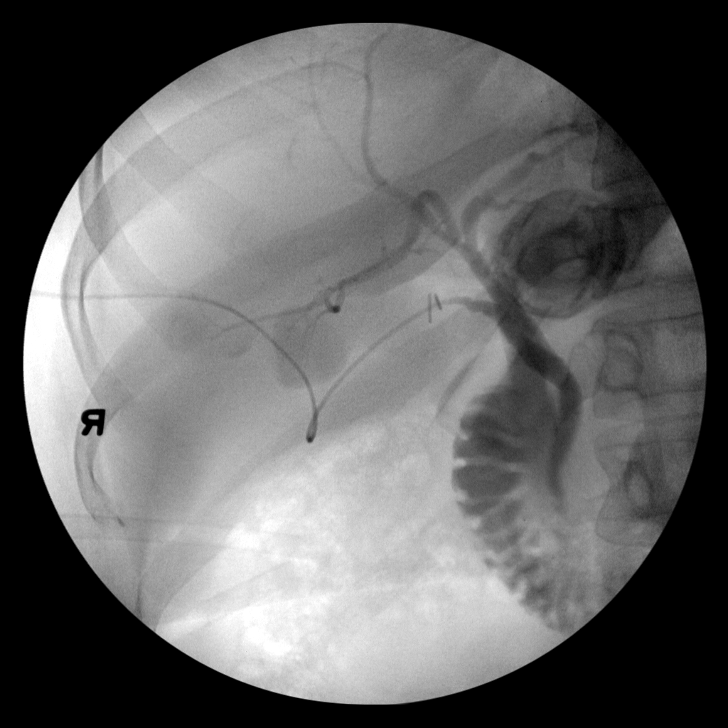
[im 2/2]
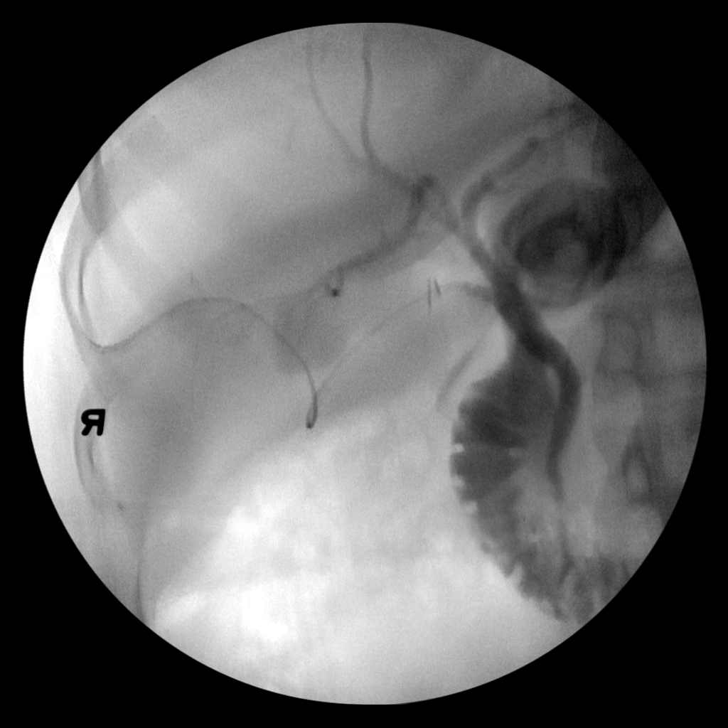
[im 2/2]
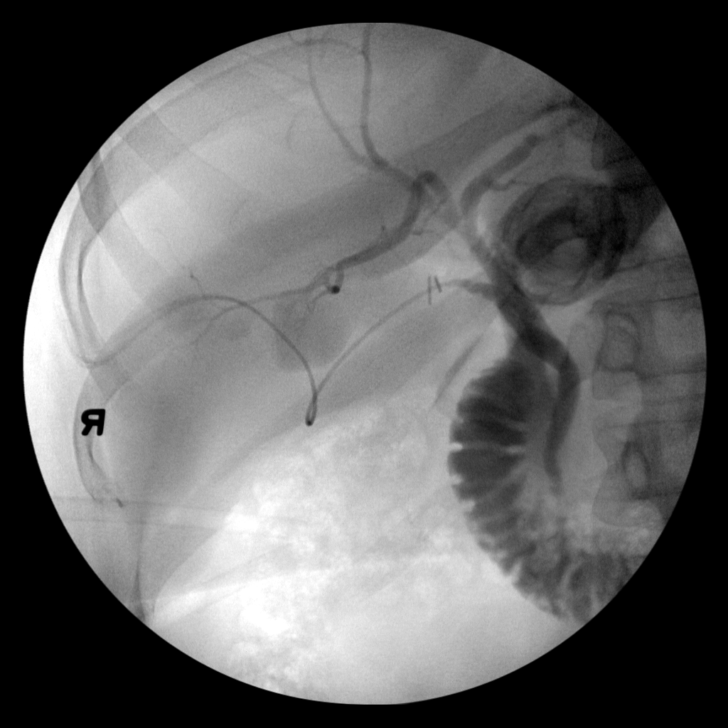
[im 2/2]
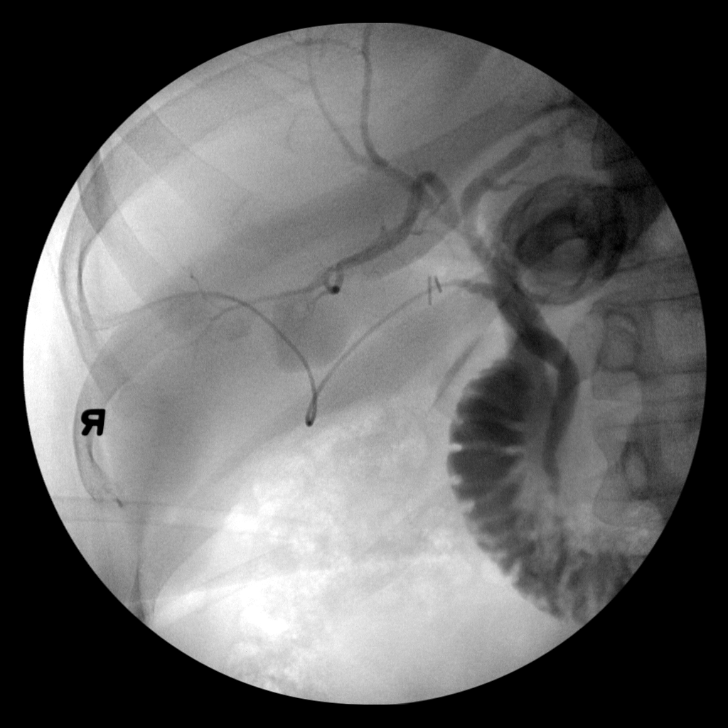

[8 of 8 positions shown; findings below may reference images not displayed]

FINDINGS: Surgical instruments project over the upper abdomen.

There is cannulation of the cystic duct/gallbladder neck, with
antegrade infusion of contrast. Caliber of the extrahepatic ductal
system within normal limits.

No large filling defect identified.

Free flow of contrast across the ampulla.
IMPRESSION: Intraoperative cholangiogram demonstrates extrahepatic biliary ducts
of unremarkable caliber, with no large filling defect identified.
Free flow of contrast across the ampulla.

Please refer to the dictated operative report for full details of
intraoperative findings and procedure

## 2017-11-27 DIAGNOSIS — F411 Generalized anxiety disorder: Secondary | ICD-10-CM | POA: Diagnosis not present

## 2018-01-03 ENCOUNTER — Encounter: Payer: Self-pay | Admitting: Adult Health

## 2018-01-03 ENCOUNTER — Ambulatory Visit: Payer: Self-pay | Admitting: Adult Health

## 2018-01-03 VITALS — BP 125/65 | HR 91 | Temp 98.4°F | Resp 18 | Ht 70.0 in | Wt 296.6 lb

## 2018-01-03 DIAGNOSIS — L237 Allergic contact dermatitis due to plants, except food: Secondary | ICD-10-CM

## 2018-01-03 DIAGNOSIS — L255 Unspecified contact dermatitis due to plants, except food: Secondary | ICD-10-CM

## 2018-01-03 MED ORDER — PREDNISONE 10 MG (21) PO TBPK: ORAL_TABLET | ORAL | 0 refills | Status: DC

## 2018-01-03 NOTE — Patient Instructions (Addendum)
Prednisone tablets What is this medicine? PREDNISONE (PRED ni sone) is a corticosteroid. It is commonly used to treat inflammation of the skin, joints, lungs, and other organs. Common conditions treated include asthma, allergies, and arthritis. It is also used for other conditions, such as blood disorders and diseases of the adrenal glands. This medicine may be used for other purposes; ask your health care provider or pharmacist if you have questions. COMMON BRAND NAME(S): Deltasone, Predone, Sterapred, Sterapred DS What should I tell my health care provider before I take this medicine? They need to know if you have any of these conditions: -Cushing's syndrome -diabetes -glaucoma -heart disease -high blood pressure -infection (especially a virus infection such as chickenpox, cold sores, or herpes) -kidney disease -liver disease -mental illness -myasthenia gravis -osteoporosis -seizures -stomach or intestine problems -thyroid disease -an unusual or allergic reaction to lactose, prednisone, other medicines, foods, dyes, or preservatives -pregnant or trying to get pregnant -breast-feeding How should I use this medicine? Take this medicine by mouth with a glass of water. Follow the directions on the prescription label. Take this medicine with food. If you are taking this medicine once a day, take it in the morning. Do not take more medicine than you are told to take. Do not suddenly stop taking your medicine because you may develop a severe reaction. Your doctor will tell you how much medicine to take. If your doctor wants you to stop the medicine, the dose may be slowly lowered over time to avoid any side effects. Talk to your pediatrician regarding the use of this medicine in children. Special care may be needed. Overdosage: If you think you have taken too much of this medicine contact a poison control center or emergency room at once. NOTE: This medicine is only for you. Do not share this  medicine with others. What if I miss a dose? If you miss a dose, take it as soon as you can. If it is almost time for your next dose, talk to your doctor or health care professional. You may need to miss a dose or take an extra dose. Do not take double or extra doses without advice. What may interact with this medicine? Do not take this medicine with any of the following medications: -metyrapone -mifepristone This medicine may also interact with the following medications: -aminoglutethimide -amphotericin B -aspirin and aspirin-like medicines -barbiturates -certain medicines for diabetes, like glipizide or glyburide -cholestyramine -cholinesterase inhibitors -cyclosporine -digoxin -diuretics -ephedrine -female hormones, like estrogens and birth control pills -isoniazid -ketoconazole -NSAIDS, medicines for pain and inflammation, like ibuprofen or naproxen -phenytoin -rifampin -toxoids -vaccines -warfarin This list may not describe all possible interactions. Give your health care provider a list of all the medicines, herbs, non-prescription drugs, or dietary supplements you use. Also tell them if you smoke, drink alcohol, or use illegal drugs. Some items may interact with your medicine. What should I watch for while using this medicine? Visit your doctor or health care professional for regular checks on your progress. If you are taking this medicine over a prolonged period, carry an identification card with your name and address, the type and dose of your medicine, and your doctor's name and address. This medicine may increase your risk of getting an infection. Tell your doctor or health care professional if you are around anyone with measles or chickenpox, or if you develop sores or blisters that do not heal properly. If you are going to have surgery, tell your doctor or health care professional that  you have taken this medicine within the last twelve months. Ask your doctor or health  care professional about your diet. You may need to lower the amount of salt you eat. This medicine may affect blood sugar levels. If you have diabetes, check with your doctor or health care professional before you change your diet or the dose of your diabetic medicine. What side effects may I notice from receiving this medicine? Side effects that you should report to your doctor or health care professional as soon as possible: -allergic reactions like skin rash, itching or hives, swelling of the face, lips, or tongue -changes in emotions or moods -changes in vision -depressed mood -eye pain -fever or chills, cough, sore throat, pain or difficulty passing urine -increased thirst -swelling of ankles, feet Side effects that usually do not require medical attention (report to your doctor or health care professional if they continue or are bothersome): -confusion, excitement, restlessness -headache -nausea, vomiting -skin problems, acne, thin and shiny skin -trouble sleeping -weight gain This list may not describe all possible side effects. Call your doctor for medical advice about side effects. You may report side effects to FDA at 1-800-FDA-1088. Where should I keep my medicine? Keep out of the reach of children. Store at room temperature between 15 and 30 degrees C (59 and 86 degrees F). Protect from light. Keep container tightly closed. Throw away any unused medicine after the expiration date. NOTE: This sheet is a summary. It may not cover all possible information. If you have questions about this medicine, talk to your doctor, pharmacist, or health care provider.  2018 Elsevier/Gold Standard (2011-01-05 10:57:14) Poison Oak Dermatitis Poison oak dermatitis is inflammation of the skin that is caused by contact with the allergens on the leaves of the poison oak (toxicodendron) plant. The skin reaction often includes redness, swelling, blisters, and extreme itching. What are the  causes? This condition is caused by a specific chemical (urushiol) that is found in the sap of the poison oak plant. This chemical is sticky and it can be easily spread to people, animals, and objects. You can get poison oak dermatitis by:  Having direct contact with a poison oak plant.  Touching animals, other people, or objects that have come in contact with poison oak and have the chemical on them.  What increases the risk? This condition is more likely to develop in people who:  Are outdoors often.  Go outdoors without wearing protective clothing, such as closed shoes, long pants, and a long-sleeved shirt.  What are the signs or symptoms? Symptoms of this condition include:  Redness of the skin.  A rash that may develop blisters.  Extreme itching.  Swelling. This may occur if the reaction is more severe.  Symptoms usually last for 1-2 weeks. However, the first time you develop this condition, symptoms may last 3-4 weeks. How is this diagnosed? This condition may be diagnosed based on your symptoms and a physical exam. Your health care provider may also ask you about any recent outdoor activity. How is this treated? Treatment for this condition will vary depending on how severe it is. Treatment may include:  Hydrocortisone creams or calamine lotions to relieve itching.  Oatmeal baths to soothe the skin.  Over-the-counter antihistamine tablets.  Oral steroid medicine for more severe outbreaks.  Follow these instructions at home:  Take or apply over-the-counter and prescription medicines only as told by your health care provider.  Wash exposed skin as soon as possible with soap and  cold water.  Use hydrocortisone creams or calamine lotion as needed to soothe the skin and relieve itching.  Take oatmeal baths as needed. Use colloidal oatmeal. You can get this at your local pharmacy or grocery store. Follow the instructions on the packaging.  Do not scratch or rub your  skin.  While you have the rash, wash clothes right after you wear them. How is this prevented?  Learn to identify the poison oak plant and avoid contact with the plant. This plant can be recognized by the number of leaves. Generally, poison oak has three leaves with flowering branches on a single stem. The leaves are often a bit fuzzy and have a toothlike edge.  If you have been exposed to poison oak, thoroughly wash with soap and water right away. You have about 30 minutes to remove the plant resin before it will cause the rash. Be sure to wash under your fingernails because any plant resin there will continue to spread the rash.  When hiking or camping, wear clothes that will help you avoid exposure on the skin. This includes long pants, a long-sleeved shirt, tall socks, and hiking boots. You can also apply preventive lotion to your skin to help limit exposure.  If you suspect that your clothes or outdoor gear came in contact with poison oak, rinse them off outside with a garden hose before bringing them inside your house. Contact a health care provider if:  You have open sores in the rash area.  You have more redness, swelling, or pain in the affected area.  You have redness that spreads beyond the rash area.  You have fluid, blood, or pus coming from the affected area.  You have a fever.  You have a rash over a large area of your body.  You have a rash on your eyes, mouth, or genitals.  Your rash does not improve after a few days. Get help right away if:  Your face swells or your eyes swell shut.  You have trouble breathing.  You have trouble swallowing. This information is not intended to replace advice given to you by your health care provider. Make sure you discuss any questions you have with your health care provider. Document Released: 11/26/2002 Document Revised: 10/28/2015 Document Reviewed: 10/28/2014 Elsevier Interactive Patient Education  [employer]2018 Elsevier Inc.

## 2018-01-03 NOTE — Progress Notes (Signed)
Subjective:     Patient ID: Rhonda Santiago, female   DOB: 1970/09/09, 47 y.o.   MRN: [id number]     Patient is a 47 year old female in no acute distress who comes to the clinic with complaints of rash as described below.   Rash  This is a new problem. The current episode started yesterday. The problem has been gradually worsening since onset. The affected locations include the face, left hand, left arm, right arm, right hand, right lower leg and left lower leg (face- peri oral left side only and on cheek and forehead ). The rash is characterized by blistering and itchiness. She was exposed to plant contact (expose to poison oak while working outside / cut a weed down on the edge of her house. ). Pertinent negatives include no anorexia, congestion, cough, diarrhea, eye pain, facial edema, fatigue, fever, joint pain, nail changes, rhinorrhea, shortness of breath, sore throat or vomiting. Past treatments include nothing. Improvement on treatment: none tried      Patient  denies any fever, body aches,chills, rash, chest pain, shortness of breath, nausea, vomiting, or diarrhea.   No Known Allergies  Review of Systems  Constitutional: Negative.  Negative for fatigue and fever.  HENT: Negative.  Negative for congestion, rhinorrhea and sore throat.   Eyes: Negative.  Negative for pain.  Respiratory: Negative.  Negative for cough and shortness of breath.   Cardiovascular: Negative.   Gastrointestinal: Negative.  Negative for anorexia, diarrhea and vomiting.  Genitourinary: Negative.   Musculoskeletal: Negative.  Negative for joint pain.  Skin: Positive for rash. Negative for color change, nail changes, pallor and wound.  Neurological: Negative.   Hematological: Negative.   Psychiatric/Behavioral: Negative.        Objective:   Physical Exam  Constitutional: She is oriented to person, place, and time. She appears well-developed and well-nourished. No distress.  HENT:  Head: Normocephalic  and atraumatic.  Mouth/Throat: No oropharyngeal exudate.  Neck: Normal range of motion. Neck supple.  Cardiovascular: Normal rate, regular rhythm, normal heart sounds and intact distal pulses. Exam reveals no gallop and no friction rub.  No murmur heard. Pulmonary/Chest: Effort normal and breath sounds normal. No stridor. No respiratory distress. She has no wheezes. She has no rales. She exhibits no tenderness.  Abdominal: Soft. Bowel sounds are normal.  Musculoskeletal: Normal range of motion.  Neurological: She is alert and oriented to person, place, and time.  Skin: Skin is warm and dry. Capillary refill takes less than 2 seconds. Rash noted. No purpura noted. Rash is maculopapular and vesicular. Rash is not macular, not papular, not nodular, not pustular and not urticarial. She is not diaphoretic. There is erythema. No pallor. Nails show no clubbing.     Rash documented on diagram.  No eye involvement. Areas on face only left peri-oral and left cheek  above right eye brow on forehead. No eye involvement, Scattered areas on bilateral upper and lower extremities.  Scattered clear drainage from scratching small vesicles while in room.  No spreading erythema, or warmth of skin.  Psychiatric: She has a normal mood and affect. Her behavior is normal. Judgment and thought content normal.  Vitals reviewed.      Assessment:     Allergic contact dermatitis due to plants, except food  Dermatitis due to plants, including poison ivy, sumac, and oak      Plan:     Meds ordered this encounter  Medications  . predniSONE (STERAPRED UNI-PAK 21 TAB) 10 MG (  21) TBPK tablet    Sig: PO: Take 6 tablets on day 1:Take 5 tablets day 2:Take 4 tablets day 3: Take 3 tablets day 4:Take 2 tablets day five: 5 Take 1 tablet day 6    Dispense:  21 tablet    Refill:  0    Benadryl per package instructions at hour of sleep.  Over the counter of choice choose one  Ivarest, Tecnu cream or calamine. Oatmeal  baths fine. No hot water in shower or tub take cool showers or baths. No scrubbing.  Return to the clinic if any eye involvement ir be seen at after hours clinic if office is closed.   Advised patient call the office or your primary care doctor for an appointment if no improvement within 72 hours or if any symptoms change or worsen at any time  Advised ER or urgent Care if after hours or on weekend. Call 911 for emergency symptoms at any time.Patinet verbalized understanding of all instructions given/reviewed and treatment plan and has no further questions or concerns at this time.    Patient verbalized understanding of all instructions given and denies any further questions at this time.

## 2018-01-11 ENCOUNTER — Encounter: Payer: Self-pay | Admitting: Adult Health

## 2018-01-11 ENCOUNTER — Ambulatory Visit: Payer: Self-pay | Admitting: Adult Health

## 2018-01-11 VITALS — BP 128/78 | HR 79 | Temp 98.2°F | Resp 16 | Ht 70.0 in | Wt 295.0 lb

## 2018-01-11 DIAGNOSIS — L237 Allergic contact dermatitis due to plants, except food: Secondary | ICD-10-CM

## 2018-01-11 MED ORDER — PREDNISONE 10 MG (48) PO TBPK: ORAL_TABLET | ORAL | 0 refills | Status: DC

## 2018-01-11 NOTE — Patient Instructions (Signed)
Poison Oak Dermatitis  Poison oak dermatitis is inflammation of the skin that is caused by contact with the allergens on the leaves of the poison oak (toxicodendron) plant. The skin reaction often includes redness, swelling, blisters, and extreme itching.  What are the causes?  This condition is caused by a specific chemical (urushiol) that is found in the sap of the poison oak plant. This chemical is sticky and it can be easily spread to people, animals, and objects. You can get poison oak dermatitis by:  · Having direct contact with a poison oak plant.  · Touching animals, other people, or objects that have come in contact with poison oak and have the chemical on them.    What increases the risk?  This condition is more likely to develop in people who:  · Are outdoors often.  · Go outdoors without wearing protective clothing, such as closed shoes, long pants, and a long-sleeved shirt.    What are the signs or symptoms?  Symptoms of this condition include:  · Redness of the skin.  · A rash that may develop blisters.  · Extreme itching.  · Swelling. This may occur if the reaction is more severe.    Symptoms usually last for 1-2 weeks. However, the first time you develop this condition, symptoms may last 3-4 weeks.  How is this diagnosed?  This condition may be diagnosed based on your symptoms and a physical exam. Your health care provider may also ask you about any recent outdoor activity.  How is this treated?  Treatment for this condition will vary depending on how severe it is. Treatment may include:  · Hydrocortisone creams or calamine lotions to relieve itching.  · Oatmeal baths to soothe the skin.  · Over-the-counter antihistamine tablets.  · Oral steroid medicine for more severe outbreaks.    Follow these instructions at home:  · Take or apply over-the-counter and prescription medicines only as told by your health care provider.  · Wash exposed skin as soon as possible with soap and cold water.  · Use  hydrocortisone creams or calamine lotion as needed to soothe the skin and relieve itching.  · Take oatmeal baths as needed. Use colloidal oatmeal. You can get this at your local pharmacy or grocery store. Follow the instructions on the packaging.  · Do not scratch or rub your skin.  · While you have the rash, wash clothes right after you wear them.  How is this prevented?  · Learn to identify the poison oak plant and avoid contact with the plant. This plant can be recognized by the number of leaves. Generally, poison oak has three leaves with flowering branches on a single stem. The leaves are often a bit fuzzy and have a toothlike edge.  · If you have been exposed to poison oak, thoroughly wash with soap and water right away. You have about 30 minutes to remove the plant resin before it will cause the rash. Be sure to wash under your fingernails because any plant resin there will continue to spread the rash.  · When hiking or camping, wear clothes that will help you avoid exposure on the skin. This includes long pants, a long-sleeved shirt, tall socks, and hiking boots. You can also apply preventive lotion to your skin to help limit exposure.  · If you suspect that your clothes or outdoor gear came in contact with poison oak, rinse them off outside with a garden hose before bringing them inside your house.    Contact a health care provider if:  · You have open sores in the rash area.  · You have more redness, swelling, or pain in the affected area.  · You have redness that spreads beyond the rash area.  · You have fluid, blood, or pus coming from the affected area.  · You have a fever.  · You have a rash over a large area of your body.  · You have a rash on your eyes, mouth, or genitals.  · Your rash does not improve after a few days.  Get help right away if:  · Your face swells or your eyes swell shut.  · You have trouble breathing.  · You have trouble swallowing.  This information is not intended to replace advice  given to you by your health care provider. Make sure you discuss any questions you have with your health care provider.  Document Released: 11/26/2002 Document Revised: 10/28/2015 Document Reviewed: 10/28/2014  Elsevier Interactive Patient Education © 2018 Elsevier Inc.

## 2018-01-11 NOTE — Progress Notes (Signed)
Subjective:     Patient ID: Rhonda Santiago, female   DOB: 12-06-1970, 47 y.o.   MRN: [id number]  HPI   Blood pressure 128/78, pulse 79, temperature 98.2 F (36.8 C), resp. rate 16, height 5\' 10"  (1.778 m), weight 295 lb (133.8 kg), last menstrual period 10/11/2017, SpO2 98 %.  Patient is a 47 year old female in no acute distress who comes to clinic for follow up on poison oak she reports her facial poison oak has resolved.  She finished a 5 day taper of oral prednisone on 01/07/18 after initial office visit 01/03/18.She reports area on left leg , right leg and new areas on bilateral hips and posterior thighs. Areas are pruritic.   Patient  denies any fever, body aches,chills, rash, chest pain, shortness of breath, nausea, vomiting, or diarrhea.    Review of Systems  Constitutional: Negative.   HENT: Negative.   Respiratory: Negative.   Cardiovascular: Negative.   Gastrointestinal: Negative.   Musculoskeletal: Negative.   Skin: Positive for color change (pink areas ) and rash. Negative for pallor and wound.  Allergic/Immunologic: Negative for environmental allergies, food allergies and immunocompromised state.  Neurological: Negative.   Hematological: Negative.   Psychiatric/Behavioral: Negative.        Objective:   Physical Exam  Constitutional: She is oriented to person, place, and time. Vital signs are normal. She appears well-developed and well-nourished. She is active. No distress.  Patient is alert and oriented and responsive to questions Engages in eye contact with provider. Speaks in full sentences without any pauses without any shortness of breath or distress.    HENT:  Head: Normocephalic and atraumatic.  Mouth/Throat: No oropharyngeal exudate.  Neck: Normal range of motion. Neck supple.  Cardiovascular: Normal rate, regular rhythm, normal heart sounds and intact distal pulses. Exam reveals no gallop and no friction rub.  No murmur heard. Pulmonary/Chest: Effort  normal and breath sounds normal. No stridor. No respiratory distress. She has no wheezes. She has no rales. She exhibits no tenderness.  Abdominal: Soft. Bowel sounds are normal.  Musculoskeletal: Normal range of motion.  Neurological: She is alert and oriented to person, place, and time.  Patient moves on and off of exam table and in room without difficulty. Gait is normal in hall and in room. Patient is oriented to person place time and situation. Patient answers questions appropriately and engages in conversation.   Skin: Skin is warm and dry. Capillary refill takes less than 2 seconds. Rash noted. No purpura noted. Rash is maculopapular and vesicular. Rash is not macular, not papular, not nodular, not pustular and not urticarial. She is not diaphoretic. There is erythema (pink ). No pallor. Nails show no clubbing.     Rash documented on diagram in blue ink.  No eye involvement. Areas on face have completely resolved since last visit. Scattered areas on bilateral upper and lower extremities.   No spreading erythema, or warmth of skin.. No drainage.   Psychiatric: She has a normal mood and affect. Her speech is normal and behavior is normal. Judgment and thought content normal. Cognition and memory are normal.  Vitals reviewed.      Assessment:     Allergic contact dermatitis due to plants, except food      Plan:     Meds ordered this encounter  Medications  . predniSONE (STERAPRED UNI-PAK 48 TAB) 10 MG (48) TBPK tablet    Sig: 6 tablets on day 1-2/5 tablets day 3-4/4 tablets day 5-6/ 3  tablets day 7-8/ 2 tablets day 9-10/1 tablet day 11-12    Dispense:  42 tablet    Refill:  0   Apply Benadryl cream to areas as well as Calamine. Cool showers, no scrubbing areas.  Continue Zyrtec daily. Can use Benadryl at night if needed- will cause drowsiness.   Advised patient call the office or your primary care doctor for an appointment if no improvement within 72 hours or if any symptoms  change or worsen at any time  Advised ER or urgent Care if after hours or on weekend. Call 911 for emergency symptoms at any time.Patinet verbalized understanding of all instructions given/reviewed and treatment plan and has no further questions or concerns at this time.    Patient verbalized understanding of all instructions given and denies any further questions at this time.

## 2018-01-15 DIAGNOSIS — F411 Generalized anxiety disorder: Secondary | ICD-10-CM | POA: Diagnosis not present

## 2018-02-19 DIAGNOSIS — F411 Generalized anxiety disorder: Secondary | ICD-10-CM | POA: Diagnosis not present

## 2018-03-21 DIAGNOSIS — R87610 Atypical squamous cells of undetermined significance on cytologic smear of cervix (ASC-US): Secondary | ICD-10-CM | POA: Diagnosis not present

## 2018-03-21 DIAGNOSIS — Z1231 Encounter for screening mammogram for malignant neoplasm of breast: Secondary | ICD-10-CM | POA: Diagnosis not present

## 2018-03-21 DIAGNOSIS — Z124 Encounter for screening for malignant neoplasm of cervix: Secondary | ICD-10-CM | POA: Diagnosis not present

## 2018-03-21 DIAGNOSIS — Z01419 Encounter for gynecological examination (general) (routine) without abnormal findings: Secondary | ICD-10-CM | POA: Diagnosis not present

## 2018-03-21 DIAGNOSIS — Z6841 Body Mass Index (BMI) 40.0 and over, adult: Secondary | ICD-10-CM | POA: Diagnosis not present

## 2018-06-07 DIAGNOSIS — L853 Xerosis cutis: Secondary | ICD-10-CM | POA: Diagnosis not present

## 2018-06-07 DIAGNOSIS — L720 Epidermal cyst: Secondary | ICD-10-CM | POA: Diagnosis not present

## 2018-06-07 DIAGNOSIS — D1801 Hemangioma of skin and subcutaneous tissue: Secondary | ICD-10-CM | POA: Diagnosis not present

## 2018-06-07 DIAGNOSIS — B078 Other viral warts: Secondary | ICD-10-CM | POA: Diagnosis not present

## 2018-12-25 ENCOUNTER — Telehealth: Payer: Self-pay | Admitting: General Practice

## 2018-12-25 NOTE — Telephone Encounter (Signed)
Yes I'd be happy to see her. Thanks 

## 2018-12-25 NOTE — Telephone Encounter (Signed)
Pt called and said she would like to set up an appt with Dr Deborra Medina, pt has not been seen by Dr Deborra Medina since 04/14/2014 so she would need to re-establish, I let her know that Dr Deborra Medina is not currently accepting new patients but we do have providers at this office that are, She asked if I could message Dr Deborra Medina and see if she could still see her, I told her I would message her and call her back with a response.

## 2018-12-27 NOTE — Telephone Encounter (Signed)
I called and left a message

## 2019-02-18 NOTE — Progress Notes (Signed)
Subjective:   Patient ID: Rhonda Santiago, female    DOB: 1970/06/06, 48 y.o.   MRN: [id number]  Rhonda Santiago is a pleasant 48 y.o. year old female who presents to clinic today with Annual Exam  on 02/19/2019  HPI:  I have not seen patient since 2015.  She has lost 50 pounds!  Walks 4 miles daily.  Wt Readings from Last 3 Encounters:  02/19/19 245 lb (111.1 kg)  01/11/18 295 lb (133.8 kg)  01/03/18 296 lb 9.6 oz (134.5 kg)    Health Maintenance  Topic Date Due  . COLONOSCOPY  01/14/2019  . INFLUENZA VACCINE  04/04/2019 (Originally 01/04/2019)  . MAMMOGRAM  03/23/2020  . PAP SMEAR-Modifier  03/21/2021  . TETANUS/TDAP  10/23/2022  . HIV Screening  Completed   Mammogram scheduled for next month.  Sees her GYN on 10/20.  Has never had a colonoscopy.  No family history cancer or polyps.  Lab Results  Component Value Date   CHOL 190 04/14/2014   HDL 45.60 04/14/2014   LDLCALC 126 (H) 04/14/2014   LDLDIRECT 137.9 08/01/2011   TRIG 91.0 04/14/2014   CHOLHDL 4 04/14/2014   Lab Results  Component Value Date   NA 138 06/29/2015   K 4.1 06/29/2015   CL 105 06/29/2015   CO2 26 06/29/2015   Lab Results  Component Value Date   ALT 14 12/08/2014   AST 17 12/08/2014   ALKPHOS 92 12/08/2014   BILITOT 0.2 12/08/2014   Lab Results  Component Value Date   WBC 7.2 06/29/2015   HGB 12.3 06/29/2015   HCT 38.4 06/29/2015   MCV 82.8 06/29/2015   PLT 332 06/29/2015   Lab Results  Component Value Date   TSH 1.07 04/14/2014    Current Outpatient Medications on File Prior to Visit  Medication Sig Dispense Refill  . calcium carbonate (TUMS EX) 750 MG chewable tablet Chew 1 tablet by mouth 2 (two) times daily as needed for heartburn.    . cetirizine (ZYRTEC) 10 MG tablet Take 10 mg by mouth daily.    Marland Kitchen. ibuprofen (ADVIL,MOTRIN) 400 MG tablet Take 400 mg by mouth every 6 (six) hours as needed for mild pain.    . Multiple Vitamin (MULTI-VITAMINS) TABS Take by mouth.      No current facility-administered medications on file prior to visit.     No Known Allergies  Past Medical History:  Diagnosis Date  . Abnormal Pap smear 11/15/2009   ASCUS / colpo 12/15/2009 low grade squamous , CIN1   . Biliary colic   . Obesity     Past Surgical History:  Procedure Laterality Date  . CHOLECYSTECTOMY  07/06/2015  . CHOLECYSTECTOMY N/A 07/06/2015   Procedure: LAPAROSCOPIC CHOLECYSTECTOMY WITH INTRAOPERATIVE CHOLANGIOGRAM;  Surgeon: Darnell Levelodd Gerkin, MD;  [facility];  Service: General;  Laterality: N/A;  . WISDOM TOOTH EXTRACTION      Family History  Problem Relation Age of Onset  . Depression Father   . Brain cancer Maternal Aunt   . Stroke Maternal Grandmother   . Heart disease Maternal Grandfather   . Dementia Paternal Grandmother   . Cancer Paternal Grandfather        lung    Social History   Socioeconomic History  . Marital status: Widowed    Spouse name: Not on file  . Number of children: Not on file  . Years of education: Not on file  . Highest education level: Not on file  Occupational History  .  Not on file  Social Needs  . Financial resource strain: Not on file  . Food insecurity    Worry: Not on file    Inability: Not on file  . Transportation needs    Medical: Not on file    Non-medical: Not on file  Tobacco Use  . Smoking status: Never Smoker  . Smokeless tobacco: Never Used  Substance and Sexual Activity  . Alcohol use: No  . Drug use: No  . Sexual activity: Never    Birth control/protection: Abstinence  Lifestyle  . Physical activity    Days per week: Not on file    Minutes per session: Not on file  . Stress: Not on file  Relationships  . Social Herbalist on phone: Not on file    Gets together: Not on file    Attends religious service: Not on file    Active member of club or organization: Not on file    Attends meetings of clubs or organizations: Not on file    Relationship status: Not on file  . Intimate  partner violence    Fear of current or ex partner: Not on file    Emotionally abused: Not on file    Physically abused: Not on file    Forced sexual activity: Not on file  Other Topics Concern  . Not on file  Social History Narrative   Husband committed suicide in 2008.   Caring for her two children- ages 80 and 51.   Works at [employer].   The PMH, PSH, Social History, Family History, Medications, and allergies have been reviewed in [facility], and have been updated if relevant.    Review of Systems  Constitutional: Negative.   HENT: Negative.   Eyes: Negative.   Respiratory: Negative.   Cardiovascular: Negative.   Gastrointestinal: Negative.   Endocrine: Negative.   Genitourinary: Negative.   Musculoskeletal: Negative.   Skin: Negative.   Allergic/Immunologic: Negative.   Neurological: Negative.   Hematological: Negative.   Psychiatric/Behavioral: Negative.   All other systems reviewed and are negative.      Objective:    BP 122/70   Pulse 84   Ht 5\' 10"  (1.778 m)   Wt 245 lb (111.1 kg)   SpO2 99%   BMI 35.15 kg/m    Physical Exam   General:  Well-developed,well-nourished,in no acute distress; alert,appropriate and cooperative throughout examination Head:  normocephalic and atraumatic.   Eyes:  vision grossly intact, PERRL Ears:  R ear normal and L ear normal externally, TMs clear bilaterally Nose:  no external deformity.   Mouth:  good dentition.   Neck:  No deformities, masses, or tenderness noted. Lungs:  Normal respiratory effort, chest expands symmetrically. Lungs are clear to auscultation, no crackles or wheezes. Heart:  Normal rate and regular rhythm. S1 and S2 normal without gallop, murmur, click, rub or other extra sounds. Abdomen:  Bowel sounds positive,abdomen soft and non-tender without masses, organomegaly or hernias noted. Msk:  No deformity or scoliosis noted of thoracic or lumbar spine.   Extremities:  No clubbing, cyanosis, edema, or deformity noted  with normal full range of motion of all joints.   Neurologic:  alert & oriented X3 and gait normal.   Skin:  Intact without suspicious lesions or rashes Cervical Nodes:  No lymphadenopathy noted Axillary Nodes:  No palpable lymphadenopathy Psych:  Cognition and judgment appear intact. Alert and cooperative with normal attention span and concentration. No apparent delusions, illusions, hallucinations  Assessment & Plan:   Hyperlipidemia, unspecified hyperlipidemia type - Plan: CBC with Differential/Platelet, Comprehensive metabolic panel, Lipid panel, TSH  Screening for HIV (human immunodeficiency virus) - Plan: HIV antibody (with reflex)  Well woman exam (no gynecological exam)  Morbid obesity (HCC), Chronic No follow-ups on file.

## 2019-02-19 ENCOUNTER — Ambulatory Visit (INDEPENDENT_AMBULATORY_CARE_PROVIDER_SITE_OTHER): Payer: BC Managed Care – PPO | Admitting: Family Medicine

## 2019-02-19 ENCOUNTER — Encounter: Payer: Self-pay | Admitting: Family Medicine

## 2019-02-19 VITALS — BP 122/70 | HR 84 | Ht 70.0 in | Wt 245.0 lb

## 2019-02-19 DIAGNOSIS — Z0000 Encounter for general adult medical examination without abnormal findings: Secondary | ICD-10-CM | POA: Insufficient documentation

## 2019-02-19 DIAGNOSIS — E785 Hyperlipidemia, unspecified: Secondary | ICD-10-CM | POA: Diagnosis not present

## 2019-02-19 DIAGNOSIS — Z114 Encounter for screening for human immunodeficiency virus [HIV]: Secondary | ICD-10-CM

## 2019-02-19 LAB — COMPREHENSIVE METABOLIC PANEL
ALT: 16 U/L (ref 0–35)
AST: 18 U/L (ref 0–37)
Albumin: 4.1 g/dL (ref 3.5–5.2)
Alkaline Phosphatase: 77 U/L (ref 39–117)
BUN: 13 mg/dL (ref 6–23)
CO2: 27 mEq/L (ref 19–32)
Calcium: 9.7 mg/dL (ref 8.4–10.5)
Chloride: 105 mEq/L (ref 96–112)
Creatinine, Ser: 0.76 mg/dL (ref 0.40–1.20)
GFR: 80.52 mL/min (ref >60.00)
Glucose, Bld: 87 mg/dL (ref 70–99)
Potassium: 4 mEq/L (ref 3.5–5.1)
Sodium: 140 mEq/L (ref 135–145)
Total Bilirubin: 0.4 mg/dL (ref 0.2–1.2)
Total Protein: 7.2 g/dL (ref 6.0–8.3)

## 2019-02-19 LAB — CBC WITH DIFFERENTIAL/PLATELET
Basophils Absolute: 0 10*3/uL (ref 0.0–0.1)
Basophils Relative: 0.5 % (ref 0.0–3.0)
Eosinophils Absolute: 0.1 10*3/uL (ref 0.0–0.7)
Eosinophils Relative: 0.9 % (ref 0.0–5.0)
HCT: 39.2 % (ref 36.0–46.0)
Hemoglobin: 12.9 g/dL (ref 12.0–15.0)
Lymphocytes Relative: 32.7 % (ref 12.0–46.0)
Lymphs Abs: 1.8 10*3/uL (ref 0.7–4.0)
MCHC: 32.8 g/dL (ref 30.0–36.0)
MCV: 84.1 fl (ref 78.0–100.0)
Monocytes Absolute: 0.3 10*3/uL (ref 0.1–1.0)
Monocytes Relative: 6.3 % (ref 3.0–12.0)
Neutro Abs: 3.2 10*3/uL (ref 1.4–7.7)
Neutrophils Relative %: 59.6 % (ref 43.0–77.0)
Platelets: 276 10*3/uL (ref 150.0–400.0)
RBC: 4.66 Mil/uL (ref 3.87–5.11)
RDW: 15.3 % (ref 11.5–15.5)
WBC: 5.4 10*3/uL (ref 4.0–10.5)

## 2019-02-19 LAB — LIPID PANEL
Cholesterol: 177 mg/dL (ref 0–200)
HDL: 53 mg/dL (ref >39.00)
LDL Cholesterol: 108 mg/dL — ABNORMAL HIGH (ref 0–99)
NonHDL: 124.35
Total CHOL/HDL Ratio: 3
Triglycerides: 82 mg/dL (ref 0.0–149.0)
VLDL: 16.4 mg/dL (ref 0.0–40.0)

## 2019-02-19 LAB — TSH: TSH: 0.75 u[IU]/mL (ref 0.35–4.50)

## 2019-02-19 NOTE — Assessment & Plan Note (Addendum)
Reviewed preventive care protocols, scheduled due services, and updated immunizations Discussed nutrition, exercise, diet, and healthy lifestyle.  Has appt with GYN for mammogram and pap smear.  Cologuard ordered.  Getting flu shot at work.

## 2019-02-19 NOTE — Patient Instructions (Signed)
Great to see you. I will call you with your lab results from today and you can view them online.   Cologuard will be sent sent to your home.  You look amazing!

## 2019-02-19 NOTE — Assessment & Plan Note (Addendum)
Has lost 50 pounds in 5 months with walking and portion control. Congratulated her on her success.

## 2019-02-20 ENCOUNTER — Telehealth: Payer: Self-pay | Admitting: Family Medicine

## 2019-02-20 LAB — HIV ANTIBODY (ROUTINE TESTING W REFLEX): HIV 1&2 Ab, 4th Generation: NONREACTIVE

## 2019-02-20 NOTE — Telephone Encounter (Signed)
Pt request to speak with Dr. Deborra Medina for the lab that was order yesterday, pt stated she was not aware of theses test going to be done and especially HIV and hyperlipidemia? Advised the pt that for CPE we topically order lab for CBC,lipid,tsh and CBC but pt still wants to speak with Dr. Deborra Medina.    Please give pt a call [phone]    Copied from [place] [id number]. Topic: General - Inquiry >> Feb 20, 2019  9:59 AM Richardo Priest, NT wrote: Reason for CRM: Patient called in regards to appointment she had 9/16 and has a few questions. Please advise and call back is [phone].

## 2019-02-20 NOTE — Telephone Encounter (Signed)
Spoke with pt and questions were answered. 

## 2019-02-28 ENCOUNTER — Encounter: Payer: Self-pay | Admitting: Family Medicine

## 2019-03-03 ENCOUNTER — Encounter: Payer: Self-pay | Admitting: Family Medicine

## 2019-03-06 ENCOUNTER — Telehealth: Payer: Self-pay

## 2019-03-06 NOTE — Telephone Encounter (Signed)
Copied from [place] [id number]. Topic: General - Inquiry >> Mar 05, 2019  4:23 PM Richardo Priest, NT wrote: Reason for CRM: Patient called in and would like to go over labs with PCP or CMA. Stated she did send message with no response. Please advise.

## 2019-03-10 ENCOUNTER — Telehealth: Payer: Self-pay

## 2019-03-10 NOTE — Telephone Encounter (Signed)
Copied from [place] [id number]. Topic: General - Inquiry >> Mar 05, 2019  4:23 PM Richardo Priest, NT wrote: Reason for CRM: Patient called in and would like to go over labs with PCP or CMA. Stated she did send message with no response. Please advise. >> Mar 10, 2019  3:12 PM Mcneil, Jacinto Reap wrote: Pt stated she still has not received a returned call to go over her lab results. Pt requests call back.

## 2019-03-11 NOTE — Telephone Encounter (Signed)
Discussed LDL with pt/thx dmf

## 2019-03-11 NOTE — Telephone Encounter (Signed)
TA- A mychart message about this was sent on 9/25

## 2019-03-12 DIAGNOSIS — Z1212 Encounter for screening for malignant neoplasm of rectum: Secondary | ICD-10-CM | POA: Diagnosis not present

## 2019-03-12 DIAGNOSIS — Z1211 Encounter for screening for malignant neoplasm of colon: Secondary | ICD-10-CM | POA: Diagnosis not present

## 2019-03-12 LAB — COLOGUARD: Cologuard: NEGATIVE

## 2019-03-13 ENCOUNTER — Other Ambulatory Visit: Payer: Self-pay

## 2019-03-21 ENCOUNTER — Other Ambulatory Visit: Payer: Self-pay

## 2019-03-21 DIAGNOSIS — Z20822 Contact with and (suspected) exposure to covid-19: Secondary | ICD-10-CM

## 2019-03-22 LAB — NOVEL CORONAVIRUS, NAA: SARS-CoV-2, NAA: NOT DETECTED

## 2019-03-26 ENCOUNTER — Ambulatory Visit: Payer: Self-pay

## 2019-03-26 ENCOUNTER — Other Ambulatory Visit: Payer: Self-pay

## 2019-03-26 DIAGNOSIS — Z23 Encounter for immunization: Secondary | ICD-10-CM

## 2019-03-31 DIAGNOSIS — Z124 Encounter for screening for malignant neoplasm of cervix: Secondary | ICD-10-CM | POA: Diagnosis not present

## 2019-03-31 DIAGNOSIS — Z01419 Encounter for gynecological examination (general) (routine) without abnormal findings: Secondary | ICD-10-CM | POA: Diagnosis not present

## 2019-04-01 ENCOUNTER — Other Ambulatory Visit: Payer: Self-pay

## 2019-04-01 ENCOUNTER — Ambulatory Visit: Payer: Self-pay

## 2019-04-01 NOTE — Progress Notes (Unsigned)
Nutrition Counseling per telephone 04/01/2019  CC: Seeking advice regarding health and eating patterns as she has turned 48 years of age and experiencing peri-menopausal changes.  Assessment: HT: 5\' 10"   WT: 237 lbs  (per her scales at home). Labs: Cholesterol WNL except LDL at 108 mg/dl.   Glucose at 87 mg/dl. Medical Dx: Hyperlipidemia  And Morbid Obesity.  HX: 48 yr old female reports a life long history of gaining and losing weight.  During her 47's and 30's she gained and lost 80 lbs.  Now starting her 20's she determined that she needed to lose weight and to keep it off.  Since September 15, 2018; she has lost 56 lbs by her scales.  She is doing this by eating "healthy",generally omitting sweetened tea from her diet, and walking or exercising daily.  Currently, she averages walking 10 miles per day (includes walking stairs, walking to office from parking lot and daily walks in the morning and evening).    Diet recall reveals a grasp of healthy eating.  Eats 3 meals and 1 snack per day.  Drinks primarily water.  Sweetened tea has been the food in her history that she overly indulges in.  Currently, she notes,"I am not going to tell myself that I cannot have it but, I do know that I have the restraint to drink 1 glass." Cake is the only sweet that she notes to be a problem, but reports being able to eat a few bites and make a serving last for 3 days.  Rarely eats out.  Will usually have a child's plate or serving.  Primarily eats chicken.  Limits fried foods.  Is trying to eat milder "less fishy tasting seafood." Recall reveals balance of vegetables and protein, carbohydrates, limiting fatty and fried foods.  Goal: To maintain my health.  Lose to at least 175 lbs.  Stay active.  Recommendations: 1. Try using the organic Stevia as you have suggested as a substitute for that rare binge of a glass of sweetened tea. 2. Keep up your exercising/walking. 3. Maintain the 3 meals and 1 snack as this works  for you. 4. Keep your attitude of moderation regarding food. 5. Work with your MD and help with determining your hormonal needs as you age. 6. If you find that your are failing in your weight loss, please get back with me for further consult and support.  Maggie Morgin Halls, RN, RD, LDN

## 2019-04-02 DIAGNOSIS — Z1231 Encounter for screening mammogram for malignant neoplasm of breast: Secondary | ICD-10-CM | POA: Diagnosis not present

## 2019-05-08 ENCOUNTER — Other Ambulatory Visit: Payer: Self-pay

## 2019-05-08 DIAGNOSIS — Z20822 Contact with and (suspected) exposure to covid-19: Secondary | ICD-10-CM

## 2019-05-11 LAB — NOVEL CORONAVIRUS, NAA: SARS-CoV-2, NAA: NOT DETECTED

## 2019-07-07 ENCOUNTER — Ambulatory Visit: Payer: BC Managed Care – PPO | Attending: Internal Medicine

## 2019-07-07 DIAGNOSIS — Z20822 Contact with and (suspected) exposure to covid-19: Secondary | ICD-10-CM | POA: Insufficient documentation

## 2019-07-08 LAB — NOVEL CORONAVIRUS, NAA: SARS-CoV-2, NAA: NOT DETECTED

## 2019-07-13 DIAGNOSIS — Z20828 Contact with and (suspected) exposure to other viral communicable diseases: Secondary | ICD-10-CM | POA: Diagnosis not present

## 2019-07-14 ENCOUNTER — Other Ambulatory Visit: Payer: Self-pay

## 2019-08-08 DIAGNOSIS — D229 Melanocytic nevi, unspecified: Secondary | ICD-10-CM | POA: Diagnosis not present

## 2019-08-08 DIAGNOSIS — L821 Other seborrheic keratosis: Secondary | ICD-10-CM | POA: Diagnosis not present

## 2019-08-08 DIAGNOSIS — D239 Other benign neoplasm of skin, unspecified: Secondary | ICD-10-CM | POA: Diagnosis not present

## 2019-08-08 DIAGNOSIS — L853 Xerosis cutis: Secondary | ICD-10-CM | POA: Diagnosis not present

## 2019-09-26 ENCOUNTER — Telehealth: Payer: Self-pay | Admitting: Nurse Practitioner

## 2019-09-26 NOTE — Telephone Encounter (Signed)
Needs to schedule video appt 

## 2019-09-26 NOTE — Telephone Encounter (Signed)
Patient and her daughter have questions about getting the vaccine after having Covid. Patient called our vaccine clinic already and they told her to call her doctor's office instead.

## 2019-09-26 NOTE — Telephone Encounter (Signed)
Patient declined to schedule for VV, stated she already talked to [facility] and had all her questions answered.

## 2020-03-10 ENCOUNTER — Other Ambulatory Visit: Payer: Self-pay

## 2020-03-10 ENCOUNTER — Encounter: Payer: Self-pay | Admitting: Nurse Practitioner

## 2020-03-10 ENCOUNTER — Ambulatory Visit: Payer: BC Managed Care – PPO | Admitting: Nurse Practitioner

## 2020-03-10 VITALS — BP 110/80 | HR 94 | Temp 97.0°F | Ht 70.0 in | Wt 282.2 lb

## 2020-03-10 DIAGNOSIS — Z13220 Encounter for screening for lipoid disorders: Secondary | ICD-10-CM | POA: Diagnosis not present

## 2020-03-10 DIAGNOSIS — Z0000 Encounter for general adult medical examination without abnormal findings: Secondary | ICD-10-CM | POA: Diagnosis not present

## 2020-03-10 DIAGNOSIS — Z1159 Encounter for screening for other viral diseases: Secondary | ICD-10-CM | POA: Diagnosis not present

## 2020-03-10 DIAGNOSIS — Z136 Encounter for screening for cardiovascular disorders: Secondary | ICD-10-CM

## 2020-03-10 NOTE — Progress Notes (Addendum)
Subjective:    Patient ID: Rhonda Santiago, female    DOB: [birth year], 49 y.o.   MRN: [id number]  Patient presents today for CPE and transfer care from Dr. Dayton Martes   HPI  Sexual History (orientation,birth control, marital status, STD):not sexually active, up to date with breast and pelvic exam, completed by GYN per patient. Up to date with mammogram and colon cancer screen  Depression/Suicide: Depression screen [facility] 2/9 03/10/2020 02/19/2019 06/09/2013  Decreased Interest 0 0 0  Down, Depressed, Hopeless 0 0 0  PHQ - 2 Score 0 0 0   Vision:up to date  Dental:up to date  Immunizations: (TDAP, Hep C screen, Pneumovax, Influenza, zoster)  Health Maintenance  Topic Date Due  .  Hepatitis C: One time screening is recommended by Center for Disease Control  (CDC) for  adults born from [birth year] through 1967.   Never done  . Mammogram  03/23/2020  . Pap Smear  03/21/2021  . Cologuard (Stool DNA test)  03/11/2022  . Tetanus Vaccine  10/23/2022  . Flu Shot  Completed  . COVID-19 Vaccine  Completed  . HIV Screening  Completed   Diet:regular.  Weight:  Wt Readings from Last 3 Encounters:  03/10/20 282 lb 3.2 oz (128 kg)  02/19/19 245 lb (111.1 kg)  01/11/18 295 lb (133.8 kg)   Fall Risk: Fall Risk  03/10/2020 06/09/2013  Falls in the past year? 0 No  Number falls in past yr: 0 -  Injury with Fall? 0 -  Risk for fall due to : No Fall Risks -   Medications and allergies reviewed with patient and updated if appropriate.  Patient Active Problem List   Diagnosis Date Noted  . Well woman exam (no gynecological exam) 02/19/2019  . Morbid obesity (HCC) 01/13/2014  . Cervical intraepithelial neoplasia grade 1 06/05/2006    Current Outpatient Medications on File Prior to Visit  Medication Sig Dispense Refill  . calcium carbonate (TUMS EX) 750 MG chewable tablet Chew 1 tablet by mouth 2 (two) times daily as needed for heartburn.    . cetirizine (ZYRTEC) 10 MG tablet Take 10 mg by mouth  daily.    Marland Kitchen ibuprofen (ADVIL,MOTRIN) 400 MG tablet Take 400 mg by mouth every 6 (six) hours as needed for mild pain.    . Multiple Vitamin (MULTI-VITAMINS) TABS Take by mouth.     No current facility-administered medications on file prior to visit.    Past Medical History:  Diagnosis Date  . Abnormal Pap smear 11/15/2009   ASCUS / colpo 12/15/2009 low grade squamous , CIN1   . Biliary colic   . Obesity     Past Surgical History:  Procedure Laterality Date  . CHOLECYSTECTOMY  07/06/2015  . CHOLECYSTECTOMY N/A 07/06/2015   Procedure: LAPAROSCOPIC CHOLECYSTECTOMY WITH INTRAOPERATIVE CHOLANGIOGRAM;  Surgeon: Darnell Level, MD;  Location: [facility] OR;  Service: General;  Laterality: N/A;  . WISDOM TOOTH EXTRACTION      Social History   Socioeconomic History  . Marital status: Widowed    Spouse name: Not on file  . Number of children: Not on file  . Years of education: Not on file  . Highest education level: Not on file  Occupational History  . Not on file  Tobacco Use  . Smoking status: Never Smoker  . Smokeless tobacco: Never Used  Substance and Sexual Activity  . Alcohol use: No  . Drug use: No  . Sexual activity: Never    Birth control/protection: Abstinence  Other  Topics Concern  . Not on file  Social History Narrative   Husband committed suicide in 2008.   Caring for her two children- ages 49 and 52.   Works at [employer].   Social Determinants of Health   Financial Resource Strain:   . Difficulty of Paying Living Expenses: Not on file  Food Insecurity:   . Worried About Programme researcher, broadcasting/film/video in the Last Year: Not on file  . Ran Out of Food in the Last Year: Not on file  Transportation Needs:   . Lack of Transportation (Medical): Not on file  . Lack of Transportation (Non-Medical): Not on file  Physical Activity:   . Days of Exercise per Week: Not on file  . Minutes of Exercise per Session: Not on file  Stress:   . Feeling of Stress : Not on file  Social Connections:   .  Frequency of Communication with Friends and Family: Not on file  . Frequency of Social Gatherings with Friends and Family: Not on file  . Attends Religious Services: Not on file  . Active Member of Clubs or Organizations: Not on file  . Attends Banker Meetings: Not on file  . Marital Status: Not on file   Family History  Problem Relation Age of Onset  . Depression Father   . Brain cancer Maternal Aunt   . Stroke Maternal Grandmother   . Heart disease Maternal Grandfather   . Dementia Paternal Grandmother   . Cancer Paternal Grandfather        lung        Review of Systems  Constitutional: Negative for fever, malaise/fatigue and weight loss.  HENT: Negative for congestion and sore throat.   Eyes:       Negative for visual changes  Respiratory: Negative for cough and shortness of breath.   Cardiovascular: Negative for chest pain, palpitations and leg swelling.  Gastrointestinal: Negative for blood in stool, constipation, diarrhea and heartburn.  Genitourinary: Negative for dysuria, frequency and urgency.  Musculoskeletal: Negative for falls, joint pain and myalgias.  Skin: Negative for rash.  Neurological: Negative for dizziness, sensory change and headaches.  Endo/Heme/Allergies: Does not bruise/bleed easily.  Psychiatric/Behavioral: Negative for depression, substance abuse and suicidal ideas. The patient is not nervous/anxious.    Objective:   Vitals:   03/10/20 0942  BP: 110/80  Pulse: 94  Temp: (!) 97 F (36.1 C)  SpO2: 100%   Body mass index is 40.49 kg/m.  Physical Examination:  Physical Exam Vitals reviewed.  Constitutional:      General: She is not in acute distress.    Appearance: She is well-developed. She is obese.  HENT:     Right Ear: Tympanic membrane, ear canal and external ear normal.     Left Ear: Ear canal and external ear normal.     Mouth/Throat:     Pharynx: No oropharyngeal exudate.  Eyes:     Extraocular Movements:  Extraocular movements intact.     Conjunctiva/sclera: Conjunctivae normal.  Cardiovascular:     Rate and Rhythm: Normal rate and regular rhythm.     Heart sounds: Normal heart sounds.  Pulmonary:     Effort: Pulmonary effort is normal. No respiratory distress.     Breath sounds: Normal breath sounds.  Chest:     Chest wall: No tenderness.  Abdominal:     General: Bowel sounds are normal.     Palpations: Abdomen is soft.  Genitourinary:    Comments: Deferred breast and  pelvic exam to GYN Musculoskeletal:        General: Normal range of motion.     Cervical back: Normal range of motion and neck supple.     Right lower leg: No edema.     Left lower leg: No edema.  Lymphadenopathy:     Cervical: No cervical adenopathy.  Skin:    General: Skin is warm and dry.  Neurological:     Mental Status: She is alert and oriented to person, place, and time.     Deep Tendon Reflexes: Reflexes are normal and symmetric.  Psychiatric:        Mood and Affect: Mood normal.        Behavior: Behavior normal.        Thought Content: Thought content normal.    ASSESSMENT and PLAN: This visit occurred during the SARS-CoV-2 public health emergency.  Safety protocols were in place, including screening questions prior to the visit, additional usage of staff PPE, and extensive cleaning of exam room while observing appropriate contact time as indicated for disinfecting solutions.   Yanessa was seen today for establish care.  Diagnoses and all orders for this visit:  Preventative health care -     CBC; Future -     Comprehensive metabolic panel; Future -     Lipid panel; Future -     TSH; Future -     Hepatitis C Antibody; Future  Encounter for lipid screening for cardiovascular disease -     Lipid panel; Future  Encounter for hepatitis C screening test for low risk patient -     Hepatitis C Antibody; Future  Morbid obesity (HCC)  Advised about the need for heart healthy diet and daily exercise  to promote weight loss.    Problem List Items Addressed This Visit      Other   Morbid obesity (HCC)    She is aware of her need to loss weight. She has started daily exercise and plan to make dietary changes at well. Check BMP, TSH and lipid panel today       Other Visit Diagnoses    Preventative health care    -  Primary   Relevant Orders   CBC   Comprehensive metabolic panel   Lipid panel   TSH   Hepatitis C Antibody   Encounter for lipid screening for cardiovascular disease       Relevant Orders   Lipid panel   Encounter for hepatitis C screening test for low risk patient       Relevant Orders   Hepatitis C Antibody      Follow up: Return in about 1 year (around 03/10/2021) for CPE (fasting).  Alysia Penna, NP

## 2020-03-10 NOTE — Patient Instructions (Addendum)
Take lab requisition to [facility] to have blood drawn Have lab results faxed to me You need to be fasting at least 6-8hrs prior to blood draw.  Preventive Care 39-51 Years Old, Female Preventive care refers to visits with your health care provider and lifestyle choices that can promote health and wellness. This includes:  A yearly physical exam. This may also be called an annual well check.  Regular dental visits and eye exams.  Immunizations.  Screening for certain conditions.  Healthy lifestyle choices, such as eating a healthy diet, getting regular exercise, not using drugs or products that contain nicotine and tobacco, and limiting alcohol use. What can I expect for my preventive care visit? Physical exam Your health care provider will check your:  Height and weight. This may be used to calculate body mass index (BMI), which tells if you are at a healthy weight.  Heart rate and blood pressure.  Skin for abnormal spots. Counseling Your health care provider may ask you questions about your:  Alcohol, tobacco, and drug use.  Emotional well-being.  Home and relationship well-being.  Sexual activity.  Eating habits.  Work and work Statistician.  Method of birth control.  Menstrual cycle.  Pregnancy history. What immunizations do I need?  Influenza (flu) vaccine  This is recommended every year. Tetanus, diphtheria, and pertussis (Tdap) vaccine  You may need a Td booster every 10 years. Varicella (chickenpox) vaccine  You may need this if you have not been vaccinated. Zoster (shingles) vaccine  You may need this after age 51. Measles, mumps, and rubella (MMR) vaccine  You may need at least one dose of MMR if you were born in 1957 or later. You may also need a second dose. Pneumococcal conjugate (PCV13) vaccine  You may need this if you have certain conditions and were not previously vaccinated. Pneumococcal polysaccharide (PPSV23) vaccine  You may need  one or two doses if you smoke cigarettes or if you have certain conditions. Meningococcal conjugate (MenACWY) vaccine  You may need this if you have certain conditions. Hepatitis A vaccine  You may need this if you have certain conditions or if you travel or work in places where you may be exposed to hepatitis A. Hepatitis B vaccine  You may need this if you have certain conditions or if you travel or work in places where you may be exposed to hepatitis B. Haemophilus influenzae type b (Hib) vaccine  You may need this if you have certain conditions. Human papillomavirus (HPV) vaccine  If recommended by your health care provider, you may need three doses over 6 months. You may receive vaccines as individual doses or as more than one vaccine together in one shot (combination vaccines). Talk with your health care provider about the risks and benefits of combination vaccines. What tests do I need? Blood tests  Lipid and cholesterol levels. These may be checked every 5 years, or more frequently if you are over 51 years old.  Hepatitis C test.  Hepatitis B test. Screening  Lung cancer screening. You may have this screening every year starting at age 51 if you have a 30-pack-year history of smoking and currently smoke or have quit within the past 15 years.  Colorectal cancer screening. All adults should have this screening starting at age 51 and continuing until age 51. Your health care provider may recommend screening at age 53 if you are at increased risk. You will have tests every 1-10 years, depending on your results and the  type of screening test.  Diabetes screening. This is done by checking your blood sugar (glucose) after you have not eaten for a while (fasting). You may have this done every 1-3 years.  Mammogram. This may be done every 1-2 years. Talk with your health care provider about when you should start having regular mammograms. This may depend on whether you have a family  history of breast cancer.  BRCA-related cancer screening. This may be done if you have a family history of breast, ovarian, tubal, or peritoneal cancers.  Pelvic exam and Pap test. This may be done every 3 years starting at age 51. Starting at age 51, this may be done every 5 years if you have a Pap test in combination with an HPV test. Other tests  Sexually transmitted disease (STD) testing.  Bone density scan. This is done to screen for osteoporosis. You may have this scan if you are at high risk for osteoporosis. Follow these instructions at home: Eating and drinking  Eat a diet that includes fresh fruits and vegetables, whole grains, lean protein, and low-fat dairy.  Take vitamin and mineral supplements as recommended by your health care provider.  Do not drink alcohol if: ? Your health care provider tells you not to drink. ? You are pregnant, may be pregnant, or are planning to become pregnant.  If you drink alcohol: ? Limit how much you have to 0-1 drink a day. ? Be aware of how much alcohol is in your drink. In the [place] one drink equals one 12 oz bottle of beer (355 mL), one 5 oz glass of wine (148 mL), or one 1 oz glass of hard liquor (44 mL). Lifestyle  Take daily care of your teeth and gums.  Stay active. Exercise for at least 30 minutes on 5 or more days each week.  Do not use any products that contain nicotine or tobacco, such as cigarettes, e-cigarettes, and chewing tobacco. If you need help quitting, ask your health care provider.  If you are sexually active, practice safe sex. Use a condom or other form of birth control (contraception) in order to prevent pregnancy and STIs (sexually transmitted infections).  If told by your health care provider, take low-dose aspirin daily starting at age 51. What's next?  Visit your health care provider once a year for a well check visit.  Ask your health care provider how often you should have your eyes and teeth  checked.  Stay up to date on all vaccines. This information is not intended to replace advice given to you by your health care provider. Make sure you discuss any questions you have with your health care provider. Document Revised: 01/31/2018 Document Reviewed: 01/31/2018 Elsevier Patient Education  2020 [employer].

## 2020-03-10 NOTE — Assessment & Plan Note (Signed)
She is aware of her need to loss weight. She has started daily exercise and plan to make dietary changes at well. Check BMP, TSH and lipid panel today

## 2020-03-11 ENCOUNTER — Other Ambulatory Visit: Payer: Self-pay

## 2020-03-11 DIAGNOSIS — Z0000 Encounter for general adult medical examination without abnormal findings: Secondary | ICD-10-CM

## 2020-03-12 ENCOUNTER — Other Ambulatory Visit: Payer: Self-pay | Admitting: Medical

## 2020-03-12 ENCOUNTER — Other Ambulatory Visit: Payer: Self-pay

## 2020-03-12 ENCOUNTER — Other Ambulatory Visit: Payer: BC Managed Care – PPO

## 2020-03-12 DIAGNOSIS — Z13220 Encounter for screening for lipoid disorders: Secondary | ICD-10-CM

## 2020-03-12 DIAGNOSIS — Z0000 Encounter for general adult medical examination without abnormal findings: Secondary | ICD-10-CM

## 2020-03-12 DIAGNOSIS — Z1159 Encounter for screening for other viral diseases: Secondary | ICD-10-CM

## 2020-03-12 DIAGNOSIS — Z136 Encounter for screening for cardiovascular disorders: Secondary | ICD-10-CM

## 2020-03-13 LAB — COMPREHENSIVE METABOLIC PANEL
ALT: 11 IU/L (ref 0–32)
AST: 19 IU/L (ref 0–40)
Albumin/Globulin Ratio: 1.7 (ref 1.2–2.2)
Albumin: 4.2 g/dL (ref 3.8–4.9)
Alkaline Phosphatase: 108 IU/L (ref 44–121)
BUN/Creatinine Ratio: 14 (ref 9–23)
BUN: 11 mg/dL (ref 6–24)
Bilirubin Total: 0.3 mg/dL (ref 0.0–1.2)
CO2: 23 mmol/L (ref 20–29)
Calcium: 8.9 mg/dL (ref 8.7–10.2)
Chloride: 102 mmol/L (ref 96–106)
Creatinine, Ser: 0.81 mg/dL (ref 0.57–1.00)
GFR calc Af Amer: 97 mL/min/{1.73_m2} (ref >59)
GFR calc non Af Amer: 84 mL/min/{1.73_m2} (ref >59)
Globulin, Total: 2.5 g/dL (ref 1.5–4.5)
Glucose: 86 mg/dL (ref 65–99)
Potassium: 4.6 mmol/L (ref 3.5–5.2)
Sodium: 139 mmol/L (ref 134–144)
Total Protein: 6.7 g/dL (ref 6.0–8.5)

## 2020-03-13 LAB — LIPID PANEL
Chol/HDL Ratio: 3.6 ratio (ref 0.0–4.4)
Cholesterol, Total: 215 mg/dL — ABNORMAL HIGH (ref 100–199)
HDL: 60 mg/dL (ref >39)
LDL Chol Calc (NIH): 141 mg/dL — ABNORMAL HIGH (ref 0–99)
Triglycerides: 81 mg/dL (ref 0–149)
VLDL Cholesterol Cal: 14 mg/dL (ref 5–40)

## 2020-03-13 LAB — CBC WITH DIFFERENTIAL/PLATELET
Basophils Absolute: 0.1 10*3/uL (ref 0.0–0.2)
Basos: 1 %
EOS (ABSOLUTE): 0.1 10*3/uL (ref 0.0–0.4)
Eos: 2 %
Hematocrit: 40.4 % (ref 34.0–46.6)
Hemoglobin: 13.2 g/dL (ref 11.1–15.9)
Immature Grans (Abs): 0 10*3/uL (ref 0.0–0.1)
Immature Granulocytes: 0 %
Lymphocytes Absolute: 1.5 10*3/uL (ref 0.7–3.1)
Lymphs: 35 %
MCH: 28.2 pg (ref 26.6–33.0)
MCHC: 32.7 g/dL (ref 31.5–35.7)
MCV: 86 fL (ref 79–97)
Monocytes Absolute: 0.3 10*3/uL (ref 0.1–0.9)
Monocytes: 7 %
Neutrophils Absolute: 2.4 10*3/uL (ref 1.4–7.0)
Neutrophils: 55 %
Platelets: 287 10*3/uL (ref 150–450)
RBC: 4.68 x10E6/uL (ref 3.77–5.28)
RDW: 13.3 % (ref 11.7–15.4)
WBC: 4.3 10*3/uL (ref 3.4–10.8)

## 2020-03-13 LAB — HEPATITIS C ANTIBODY: Hep C Virus Ab: <0.1 s/co ratio (ref 0.0–0.9)

## 2020-03-13 LAB — TSH: TSH: 1.43 u[IU]/mL (ref 0.450–4.500)

## 2020-04-05 DIAGNOSIS — Z1239 Encounter for other screening for malignant neoplasm of breast: Secondary | ICD-10-CM | POA: Diagnosis not present

## 2020-04-05 DIAGNOSIS — Z1231 Encounter for screening mammogram for malignant neoplasm of breast: Secondary | ICD-10-CM | POA: Diagnosis not present

## 2020-04-05 DIAGNOSIS — Z124 Encounter for screening for malignant neoplasm of cervix: Secondary | ICD-10-CM | POA: Diagnosis not present

## 2020-04-05 DIAGNOSIS — Z01419 Encounter for gynecological examination (general) (routine) without abnormal findings: Secondary | ICD-10-CM | POA: Diagnosis not present

## 2020-06-07 ENCOUNTER — Telehealth: Payer: BC Managed Care – PPO | Admitting: Medical

## 2020-06-07 ENCOUNTER — Ambulatory Visit: Payer: BC Managed Care – PPO

## 2020-06-07 ENCOUNTER — Other Ambulatory Visit: Payer: Self-pay

## 2020-06-07 DIAGNOSIS — Z20822 Contact with and (suspected) exposure to covid-19: Secondary | ICD-10-CM

## 2020-06-07 DIAGNOSIS — Z8709 Personal history of other diseases of the respiratory system: Secondary | ICD-10-CM

## 2020-06-07 LAB — POC COVID19 BINAXNOW: SARS Coronavirus 2 Ag: NEGATIVE

## 2020-06-07 MED ORDER — AMOXICILLIN-POT CLAVULANATE 875-125 MG PO TABS: 1.0000 | ORAL_TABLET | Freq: Two times a day (BID) | ORAL | 0 refills | Status: DC

## 2020-06-07 NOTE — Progress Notes (Signed)
   Subjective:    Patient ID: Rhonda Santiago, female    DOB: [birth year], 50 y.o.   MRN: [id number]  HPI  50 yo female in non acute distress. Traveling  To [place] state and then to [place] she would like antibiotics to take with her for sinusitis which she usually get this time of year. The weather has been unseasonably warm and plants are blooming. She gets sinusitis infections during the spring. Would like antibiotics for her history of  Sinusitis to take with her on her trip.     Review of Systems  Constitutional: Negative for chills and fever.    History of  Post nasal drip  takes Zyrtec daily.    Objective:   Physical Exam AXOX3 No physical exam performed due to telemedicine appointment.      Covid -19 rapid test Negative Assessment & Plan:  Negative POC  Covid-19 test. Hx of Sinusitis, wanted antibiotics for her international trip. Meds ordered this encounter  Medications  . amoxicillin-clavulanate (AUGMENTIN) 875-125 MG tablet    Sig: Take 1 tablet by mouth 2 (two) times daily. Take with food    Dispense:  20 tablet    Refill:  0   She is scheduled for Covid-19 PCR tomorrow, for her trip. She verbalizes understanding of taking  The antibiotic medication  Including , UTI, skin infections, lower respiratary infections, strep throat.  Recommended OTC Flonase and Benadryl at night, Sudafed to help dry up post nasal drip, follow instrluctions on packages. RTC as needed. Patient verbalizes understanding and has no questions at the end of our conversation.

## 2020-06-07 NOTE — Patient Instructions (Signed)

## 2020-06-08 DIAGNOSIS — Z20822 Contact with and (suspected) exposure to covid-19: Secondary | ICD-10-CM | POA: Diagnosis not present

## 2020-06-28 DIAGNOSIS — Z20822 Contact with and (suspected) exposure to covid-19: Secondary | ICD-10-CM | POA: Diagnosis not present

## 2020-09-10 DIAGNOSIS — D239 Other benign neoplasm of skin, unspecified: Secondary | ICD-10-CM | POA: Diagnosis not present

## 2020-09-10 DIAGNOSIS — L738 Other specified follicular disorders: Secondary | ICD-10-CM | POA: Diagnosis not present

## 2020-09-10 DIAGNOSIS — L821 Other seborrheic keratosis: Secondary | ICD-10-CM | POA: Diagnosis not present

## 2020-09-10 DIAGNOSIS — D229 Melanocytic nevi, unspecified: Secondary | ICD-10-CM | POA: Diagnosis not present

## 2021-03-08 DIAGNOSIS — Z23 Encounter for immunization: Secondary | ICD-10-CM | POA: Diagnosis not present

## 2021-04-07 DIAGNOSIS — Z1231 Encounter for screening mammogram for malignant neoplasm of breast: Secondary | ICD-10-CM | POA: Diagnosis not present

## 2021-04-07 DIAGNOSIS — Z124 Encounter for screening for malignant neoplasm of cervix: Secondary | ICD-10-CM | POA: Diagnosis not present

## 2021-04-07 DIAGNOSIS — Z01419 Encounter for gynecological examination (general) (routine) without abnormal findings: Secondary | ICD-10-CM | POA: Diagnosis not present

## 2021-06-16 ENCOUNTER — Telehealth: Payer: Self-pay

## 2021-06-16 NOTE — Telephone Encounter (Signed)
Patient states she is traveling abroad and she has been feeling congested, but has been managing it. Today she flew from [place] to [place] and when the plane landed she noticed that her ear felt full and she can not hear well out of it and has a lot of pressure. Pt would like to know what she should do to avoid possible ear infection? Please advise

## 2021-06-16 NOTE — Telephone Encounter (Signed)
Patient notified and verbalized understanding. 

## 2021-08-04 DIAGNOSIS — D2371 Other benign neoplasm of skin of right lower limb, including hip: Secondary | ICD-10-CM | POA: Diagnosis not present

## 2021-09-26 ENCOUNTER — Encounter: Payer: Self-pay | Admitting: Medical

## 2021-09-26 ENCOUNTER — Ambulatory Visit: Payer: BC Managed Care – PPO | Admitting: Medical

## 2021-09-26 VITALS — BP 110/78 | HR 95 | Temp 97.0°F | Resp 16

## 2021-09-26 DIAGNOSIS — M79642 Pain in left hand: Secondary | ICD-10-CM

## 2021-09-26 MED ORDER — PREDNISONE 10 MG (21) PO TBPK: ORAL_TABLET | ORAL | 0 refills | Status: DC

## 2021-09-26 NOTE — Progress Notes (Signed)
? ?  Subjective:  ? ? Patient ID: Rhonda Santiago, female    DOB: 06/19/1970, 51 y.o.   MRN: [id number] ? ?HPI ?51 yo female in non acute distress presents with a history of swelling to left hand since Thursday. Does have a history of hand pain  ( shooting pain through thumb x 2 years) but usually only lasted a short bit and then it was fine. ? ?This episode she has been take  Motrin 600 mg  with minimal relief. ?Positive swelling in all fingers and thenar. Wearing splint but  left it at her office. ? Prednisone make her  feel as if she is crawling out of her skin, will try  1/2 dose taper. If symptoms occur she is to stop the medication. ? ? ?Patient is right handed. ? ?No Known Allergies ? ?Review of Systems  ?Constitutional:  Negative for fever.  ?Musculoskeletal:   ?     Left hand pain, no numbness or tingling, shooting pain from  base of thumb up into hand.has been wearing hand splint x  2 days.  ? ?   ?Objective:  ? Physical Exam ?Constitutional:   ?   Appearance: Normal appearance. She is obese.  ?Eyes:  ?   Extraocular Movements: Extraocular movements intact.  ?   Conjunctiva/sclera: Conjunctivae normal.  ?   Pupils: Pupils are equal, round, and reactive to light.  ?Pulmonary:  ?   Effort: Pulmonary effort is normal.  ?Musculoskeletal:     ?   General: Swelling and tenderness present. No deformity or signs of injury. Normal range of motion.  ?   Comments: Left hand  ?Neurological:  ?   Mental Status: She is alert.  ? ? Left hand -Decreased grip strength compared to right hand, swelling noted on palm,  into all fingers. Fingers with FROM , thumb able to oppose. ? ? ? ?   ?Assessment & Plan:  ?Left hand pain ? Ice ,elevate, splint, . Can take  800mg   of Ibuprofen  8 hours apart with food x  7 days. ?Meds ordered this encounter  ?Medications  ? predniSONE (STERAPRED UNI-PAK 21 TAB) 10 MG (21) TBPK tablet  ?  Sig: Take  3 tablets by mouth today x 2 days , then 2 tablets x2 days then 1 tablet x 2 days.    Take  with food.  ?  Dispense:  12 tablet  ?  Refill:  0  ?  ?Follow up with [facility] as discussed. A referral has been placed. ?Patient verbalizes understanding and has no questions at discharge. ? ?

## 2021-09-28 DIAGNOSIS — M1832 Unilateral post-traumatic osteoarthritis of first carpometacarpal joint, left hand: Secondary | ICD-10-CM | POA: Diagnosis not present

## 2021-12-26 ENCOUNTER — Ambulatory Visit: Payer: BC Managed Care – PPO | Admitting: Medical

## 2021-12-26 ENCOUNTER — Encounter: Payer: Self-pay | Admitting: Medical

## 2021-12-27 ENCOUNTER — Ambulatory Visit: Payer: BC Managed Care – PPO | Admitting: Dietician

## 2021-12-27 NOTE — Progress Notes (Signed)
Nutrition Consult: 07/25/2:   CC: "I am a large woman.  I have always been large.  I have lost weight in the past.  My mother was with me on vacation and feels I have sleep apnea.  She Rhonda Santiago be right.  I want to try to lose weight before they start to test and use machines on me. "  HT: 5'10''  WT: 318 lb   BMI: 100  Gives hx of normal BP, no diabetes.  Has lost weight in the past. During COVID when times were slower, she lost 61 lbs.  When taking a new job, the routine was lost and the stress levels caused her to stop her regimen and return to unhealthy eating to add to the increased stress.  Gone was the time she used to exercise, plan meals and to cook for herself.  Her stated down fall is her high intake of sweetened tea throughout the day.  She has in the past decreased her intake.  States this is what is needed to make weight loss happen for her.  In the past she has enjoyed walking in the early morning.  She also enjoys swimming.  Diet recall reveals 3 meals per day.  Breakfast [place] usually eaten at her desk about 9:00 am.  Often raisin bran cereal with milk and a few walnuts, or a breakfast biscuit or a bagel with creamed cheese from a fast food place.  Lunch is a sandwich or a personal pizza, or a Chick Filet ' sandwich meal.  Dinner Zahrah Sutherlin be cereal wit milk, or spaghetti, or a grilled chicken sandwich or Triscuit crackers with cheese.  Notes that she does not snack and loves to cook when she has time.  Interventions: -Review of the food groups and their implications for health and a healthy weight. -Review of the need for fiber in the diet. -Review of how sweetened tea impacts the body's metabolism. -Review of the need for physical activity/exercise to promote weight loss and maintenance.  Educational Materials: Camera operator. Heathy Eating Patterns. Exercise/Activity  Recommendations: -Get back to walking.  Swim when you can. -Get back to drinking less sweetened tea to no  sweetened tea, but water. -Work on planning for yourself. Work on being a weekend warrior planning meals and meal prep for the coming week. -FY with Maggie as needed.  Maggie Rickard Kennerly, RN, RD, LDN

## 2021-12-28 NOTE — Progress Notes (Signed)
   Subjective:    Patient ID: Rhonda Santiago, female    DOB: 10/09/1970, 51 y.o.   MRN: [id number]  HPI 51 yo female in non acute distress , here to discuss sleep apnea, weight gain.   Review of Systems snoring    Objective:   Physical Exam Constitutional:      Appearance: Normal appearance. She is obese.  HENT:     Head: Normocephalic and atraumatic.  Neurological:     Mental Status: She is alert.    Adipose tissue around neck.       Assessment & Plan:  Weight gain morbid obesity Appointment with [facility] dietitian  Follow up with pcp. Patient verbalizes understanding and has no questions at discharge.

## 2022-01-04 ENCOUNTER — Telehealth: Payer: Self-pay | Admitting: Nurse Practitioner

## 2022-01-04 NOTE — Telephone Encounter (Signed)
Called & spoke w/ pt, wanted to confirm with someone if the CDC website lined up with all guidelines to having covid, says she is still having symptoms and doesn't want to pass it to her co worker, advised if she is still having symptoms she needs to stay home until she is fever & symptom free, she may still test positive for COVID.

## 2022-01-04 NOTE — Telephone Encounter (Signed)
Pt need guidance on covid protocol of her beig positive last week. Pt pt stated that it is improving but improving slowly . Please give pt  a call back

## 2022-01-10 DIAGNOSIS — E7800 Pure hypercholesterolemia, unspecified: Secondary | ICD-10-CM | POA: Diagnosis not present

## 2022-01-10 DIAGNOSIS — Z6841 Body Mass Index (BMI) 40.0 and over, adult: Secondary | ICD-10-CM | POA: Diagnosis not present

## 2022-01-10 DIAGNOSIS — Z20818 Contact with and (suspected) exposure to other bacterial communicable diseases: Secondary | ICD-10-CM | POA: Diagnosis not present

## 2022-01-10 DIAGNOSIS — M7989 Other specified soft tissue disorders: Secondary | ICD-10-CM | POA: Diagnosis not present

## 2022-01-10 DIAGNOSIS — Z051 Observation and evaluation of newborn for suspected infectious condition ruled out: Secondary | ICD-10-CM | POA: Diagnosis not present

## 2022-01-10 DIAGNOSIS — U071 COVID-19: Secondary | ICD-10-CM | POA: Diagnosis not present

## 2022-01-10 DIAGNOSIS — M79671 Pain in right foot: Secondary | ICD-10-CM | POA: Diagnosis not present

## 2022-01-10 DIAGNOSIS — Z20822 Contact with and (suspected) exposure to covid-19: Secondary | ICD-10-CM | POA: Diagnosis not present

## 2022-01-10 DIAGNOSIS — Z9189 Other specified personal risk factors, not elsewhere classified: Secondary | ICD-10-CM | POA: Diagnosis not present

## 2022-01-10 DIAGNOSIS — Z23 Encounter for immunization: Secondary | ICD-10-CM | POA: Diagnosis not present

## 2022-01-11 ENCOUNTER — Encounter: Payer: Self-pay | Admitting: Nurse Practitioner

## 2022-01-11 ENCOUNTER — Ambulatory Visit (INDEPENDENT_AMBULATORY_CARE_PROVIDER_SITE_OTHER): Payer: Self-pay | Admitting: Nurse Practitioner

## 2022-01-11 VITALS — BP 122/78 | HR 89 | Temp 97.0°F | Resp 20

## 2022-01-11 DIAGNOSIS — M7989 Other specified soft tissue disorders: Secondary | ICD-10-CM

## 2022-01-11 DIAGNOSIS — M79671 Pain in right foot: Secondary | ICD-10-CM

## 2022-01-11 NOTE — Progress Notes (Signed)
   Subjective:    Patient ID: Rhonda Santiago, female    DOB: 01-12-1971, 51 y.o.   MRN: [id number]  HPI  51 year old female presenting to [facility] with complaints of right foot swelling she noted today.  Had noted some pain on the top of her right foot 2-3 weeks ago, she (believes prior to COVID).  She has been wearing thong style berkenstock sandals most of the summer.   She has been very busy- [was at the hospital last night while her daughter gave birth to grandchild]  Of note:She had COVID  12/29/21 and continues to have some nasal congestion from that today   Today's Vitals   01/11/22 1009  BP: 122/78  Pulse: 89  Resp: 20  Temp: (!) 97 F (36.1 C)  TempSrc: Tympanic  SpO2: 98%   There is no height or weight on file to calculate BMI.  Review of Systems  Constitutional: Negative.   HENT:  Positive for congestion.   Respiratory: Negative.    Cardiovascular: Negative.   Musculoskeletal:  Positive for arthralgias.       Right foot pain and swelling  Skin: Negative.   Neurological: Negative.    Past Medical History:  Diagnosis Date   Abnormal Pap smear 11/15/2009   ASCUS / colpo 12/15/2009 low grade squamous , CIN1    Biliary colic    Obesity      Current Outpatient Medications  Medication Instructions   calcium carbonate (TUMS EX) 750 MG chewable tablet 1 tablet, Oral, 2 times daily PRN   cetirizine (ZYRTEC) 10 mg, Oral, Daily   ibuprofen (ADVIL) 400 mg, Oral, Every 6 hours PRN   Multiple Vitamin (MULTI-VITAMINS) TABS Oral       Objective:   Physical Exam HENT:     Head: Normocephalic.  Pulmonary:     Effort: Pulmonary effort is normal.  Musculoskeletal:     Right foot: Normal range of motion. Swelling, tenderness and bony tenderness present.       Feet:     Comments: Swelling and pain as highlighted. Pain increased with lateral rotation of foot. Pain increases with palpation at area. Pain does not increase with flexion or extension of  foot or individual toes. Sensation and circulation intact distal to area of pain and Swelling. 2+ pulses   Skin:    General: Skin is warm.  Neurological:     Mental Status: She is alert.          Assessment & Plan:  1. Swelling of right foot  - DG Foot Complete Right; Future  2. Pain in right foot  - DG Foot Complete Right; Future    Advised RICE for right foot, wearing supportive shoes, she will decide between outpatient XRAY and visiting [facility] if no improvement with supportive shoe and 48 hour trial of RICE as discussed   She will RTC with new or worsening symptoms as discussed

## 2022-01-12 ENCOUNTER — Ambulatory Visit: Payer: BC Managed Care – PPO | Admitting: Family

## 2022-01-12 ENCOUNTER — Ambulatory Visit (INDEPENDENT_AMBULATORY_CARE_PROVIDER_SITE_OTHER)
Admission: RE | Admit: 2022-01-12 | Discharge: 2022-01-12 | Disposition: A | Discharge status: Home / Self Care | Payer: BC Managed Care – PPO | Source: Ambulatory Visit | Attending: Family | Admitting: Family

## 2022-01-12 ENCOUNTER — Encounter: Payer: Self-pay | Admitting: Family

## 2022-01-12 VITALS — BP 126/78 | HR 84 | Temp 98.7°F | Resp 16 | Ht 70.0 in | Wt 321.5 lb

## 2022-01-12 DIAGNOSIS — Z6841 Body Mass Index (BMI) 40.0 and over, adult: Secondary | ICD-10-CM | POA: Diagnosis not present

## 2022-01-12 DIAGNOSIS — M79671 Pain in right foot: Secondary | ICD-10-CM | POA: Diagnosis not present

## 2022-01-12 DIAGNOSIS — J208 Acute bronchitis due to other specified organisms: Secondary | ICD-10-CM | POA: Insufficient documentation

## 2022-01-12 DIAGNOSIS — M7989 Other specified soft tissue disorders: Secondary | ICD-10-CM | POA: Diagnosis not present

## 2022-01-12 DIAGNOSIS — U071 COVID-19: Secondary | ICD-10-CM

## 2022-01-12 DIAGNOSIS — E7800 Pure hypercholesterolemia, unspecified: Secondary | ICD-10-CM | POA: Diagnosis not present

## 2022-01-12 LAB — CBC WITH DIFFERENTIAL/PLATELET
Basophils Absolute: 0 10*3/uL (ref 0.0–0.1)
Basophils Relative: 0.3 % (ref 0.0–3.0)
Eosinophils Absolute: 0.1 10*3/uL (ref 0.0–0.7)
Eosinophils Relative: 2 % (ref 0.0–5.0)
HCT: 36.2 % (ref 36.0–46.0)
Hemoglobin: 11.7 g/dL — ABNORMAL LOW (ref 12.0–15.0)
Lymphocytes Relative: 26.7 % (ref 12.0–46.0)
Lymphs Abs: 1.8 10*3/uL (ref 0.7–4.0)
MCHC: 32.3 g/dL (ref 30.0–36.0)
MCV: 83.6 fl (ref 78.0–100.0)
Monocytes Absolute: 0.5 10*3/uL (ref 0.1–1.0)
Monocytes Relative: 7.6 % (ref 3.0–12.0)
Neutro Abs: 4.3 10*3/uL (ref 1.4–7.7)
Neutrophils Relative %: 63.4 % (ref 43.0–77.0)
Platelets: 299 10*3/uL (ref 150.0–400.0)
RBC: 4.33 Mil/uL (ref 3.87–5.11)
RDW: 15 % (ref 11.5–15.5)
WBC: 6.7 10*3/uL (ref 4.0–10.5)

## 2022-01-12 LAB — COMPREHENSIVE METABOLIC PANEL
ALT: 21 U/L (ref 0–35)
AST: 19 U/L (ref 0–37)
Albumin: 3.8 g/dL (ref 3.5–5.2)
Alkaline Phosphatase: 87 U/L (ref 39–117)
BUN: 11 mg/dL (ref 6–23)
CO2: 27 mEq/L (ref 19–32)
Calcium: 8.9 mg/dL (ref 8.4–10.5)
Chloride: 102 mEq/L (ref 96–112)
Creatinine, Ser: 0.71 mg/dL (ref 0.40–1.20)
GFR: 97.36 mL/min (ref >60.00)
Glucose, Bld: 79 mg/dL (ref 70–99)
Potassium: 3.7 mEq/L (ref 3.5–5.1)
Sodium: 140 mEq/L (ref 135–145)
Total Bilirubin: 0.3 mg/dL (ref 0.2–1.2)
Total Protein: 7 g/dL (ref 6.0–8.3)

## 2022-01-12 LAB — LIPID PANEL
Cholesterol: 198 mg/dL (ref 0–200)
HDL: 50.2 mg/dL (ref >39.00)
LDL Cholesterol: 122 mg/dL — ABNORMAL HIGH (ref 0–99)
NonHDL: 147.96
Total CHOL/HDL Ratio: 4
Triglycerides: 132 mg/dL (ref 0.0–149.0)
VLDL: 26.4 mg/dL (ref 0.0–40.0)

## 2022-01-12 MED ORDER — AMOXICILLIN-POT CLAVULANATE 875-125 MG PO TABS: 1.0000 | ORAL_TABLET | Freq: Two times a day (BID) | ORAL | 0 refills | Status: DC

## 2022-01-12 NOTE — Assessment & Plan Note (Signed)
Work on low chol diet  Check lipid panel today Exercise as tolerated

## 2022-01-12 NOTE — Assessment & Plan Note (Signed)
Take antibiotic as prescribed. Increase oral fluids. Pt to f/u if sx worsen and or fail to improve in 2-3 days. rx augmentin 875/125 mg po bid x 10 days  

## 2022-01-12 NOTE — Assessment & Plan Note (Signed)
Xray today  Ibuprofen prn  Heat/ice to site  Wear supportive shoes

## 2022-01-12 NOTE — Progress Notes (Signed)
Established Patient Office Visit  Subjective:  Patient ID: BELITA WARSAME, female    DOB: Jun 18, 1970  Age: 51 y.o. MRN: [id number]  CC:  Chief Complaint  Patient presents with   Transitions Of Care    HPI HARIKA LAIDLAW is here for a transition of care visit.  Prior provider was: Alysia Penna, FNP  Pt is with acute concerns.   Works at [employer] went to [facility], was seen by a PA, suspected hairline stress fracture top of foot. Pain been for three weeks or so. Was started on ibuprofen 600 mg tid prn.   Covid history, still with some symptoms.  Covid positive two weeks ago.  But still improving with symptoms.  Still with cough productive, with some mild chest congestion. No ear pain. No sore throat. No fever or chills. With some nasal congestion.  Mucinex otc with mild relief.    Past Medical History:  Diagnosis Date   Abnormal Pap smear 11/15/2009   ASCUS / colpo 12/15/2009 low grade squamous , CIN1    Biliary colic    Obesity     Past Surgical History:  Procedure Laterality Date   CHOLECYSTECTOMY N/A 07/06/2015   Procedure: LAPAROSCOPIC CHOLECYSTECTOMY WITH INTRAOPERATIVE CHOLANGIOGRAM;  Surgeon: Darnell Level, MD;  Location: [facility] OR;  Service: General;  Laterality: N/A;   WISDOM TOOTH EXTRACTION      Family History  Problem Relation Age of Onset   Depression Father        suicide   Depression Sister        suicide   Stroke Maternal Grandmother    Heart disease Maternal Grandfather    Dementia Paternal Grandmother    Lung cancer Paternal Grandfather        lung   Brain cancer Maternal Aunt     Social History   Socioeconomic History   Marital status: Widowed    Spouse name: Not on file   Number of children: 2   Years of education: Not on file   Highest education level: Not on file  Occupational History   Not on file  Tobacco Use   Smoking status: Never   Smokeless tobacco: Never  Vaping Use   Vaping Use: Never used   Substance and Sexual Activity   Alcohol use: Yes    Comment: rarely   Drug use: No   Sexual activity: Not Currently    Partners: Male  Other Topics Concern   Not on file  Social History Narrative   Husband committed suicide in 2008 she was 51 y/o Works at [employer].      Son 30    Daughter 22    Social Determinants of Corporate investment banker Strain: Not on file  Food Insecurity: Not on file  Transportation Needs: Not on file  Physical Activity: Not on file  Stress: Not on file  Social Connections: Not on file  Intimate Partner Violence: Not on file    Outpatient Medications Prior to Visit  Medication Sig Dispense Refill   cetirizine (ZYRTEC) 10 MG tablet Take 10 mg by mouth daily.     Multiple Vitamin (MULTI-VITAMINS) TABS Take by mouth.     calcium carbonate (TUMS EX) 750 MG chewable tablet Chew 1 tablet by mouth 2 (two) times daily as needed for heartburn.     ibuprofen (ADVIL,MOTRIN) 400 MG tablet Take 400 mg by mouth every 6 (six) hours as needed for mild pain.     No facility-administered medications  prior to visit.    No Active Allergies      Objective:    Physical Exam Vitals reviewed.  Constitutional:      General: She is not in acute distress.    Appearance: Normal appearance. She is not ill-appearing or toxic-appearing.  HENT:     Right Ear: Tympanic membrane normal.     Left Ear: Tympanic membrane normal.     Nose: Congestion present.     Right Sinus: No maxillary sinus tenderness or frontal sinus tenderness.     Left Sinus: No maxillary sinus tenderness or frontal sinus tenderness.     Mouth/Throat:     Mouth: Mucous membranes are moist.     Pharynx: Posterior oropharyngeal erythema present. No pharyngeal swelling.     Tonsils: No tonsillar exudate.  Eyes:     Extraocular Movements: Extraocular movements intact.     Conjunctiva/sclera: Conjunctivae normal.     Pupils: Pupils are equal, round, and reactive to light.  Neck:     Thyroid: No  thyroid mass.  Cardiovascular:     Rate and Rhythm: Normal rate and regular rhythm.  Pulmonary:     Effort: Pulmonary effort is normal.     Breath sounds: Normal breath sounds.  Abdominal:     General: Abdomen is flat. Bowel sounds are normal.     Palpations: Abdomen is soft.  Musculoskeletal:        General: Normal range of motion.     Comments: Right navicular pain on palpation  Pain with flexion of right foot  Lymphadenopathy:     Cervical:     Right cervical: No superficial cervical adenopathy.    Left cervical: No superficial cervical adenopathy.  Skin:    General: Skin is warm.     Capillary Refill: Capillary refill takes less than 2 seconds.  Neurological:     General: No focal deficit present.     Mental Status: She is alert and oriented to person, place, and time.  Psychiatric:        Mood and Affect: Mood normal.        Behavior: Behavior normal.        Thought Content: Thought content normal.        Judgment: Judgment normal.      BP 126/78   Pulse 84   Temp 98.7 F (37.1 C)   Resp 16   Ht 5\' 10"  (1.778 m)   Wt (!) 321 lb 8 oz (145.8 kg)   SpO2 98%   BMI 46.13 kg/m  Wt Readings from Last 3 Encounters:  01/12/22 (!) 321 lb 8 oz (145.8 kg)  12/26/21 (!) 318 lb 3.2 oz (144.3 kg)  03/10/20 282 lb 3.2 oz (128 kg)     Health Maintenance Due  Topic Date Due   Zoster Vaccines- Shingrix (1 of 2) Never done   COVID-19 Vaccine (3 - Moderna risk series) 10/03/2019   MAMMOGRAM  03/23/2020   PAP SMEAR-Modifier  03/21/2021   INFLUENZA VACCINE  01/03/2022    There are no preventive care reminders to display for this patient.  Lab Results  Component Value Date   TSH 1.430 03/12/2020   Lab Results  Component Value Date   WBC 4.3 03/12/2020   HGB 13.2 03/12/2020   HCT 40.4 03/12/2020   MCV 86 03/12/2020   PLT 287 03/12/2020   Lab Results  Component Value Date   NA 139 03/12/2020   K 4.6 03/12/2020   CO2 23 03/12/2020  GLUCOSE 86 03/12/2020   BUN  11 03/12/2020   CREATININE 0.81 03/12/2020   BILITOT 0.3 03/12/2020   ALKPHOS 108 03/12/2020   AST 19 03/12/2020   ALT 11 03/12/2020   PROT 6.7 03/12/2020   ALBUMIN 4.2 03/12/2020   CALCIUM 8.9 03/12/2020   ANIONGAP 7 06/29/2015   GFR 80.52 02/19/2019   Lab Results  Component Value Date   CHOL 215 (H) 03/12/2020   Lab Results  Component Value Date   HDL 60 03/12/2020   Lab Results  Component Value Date   LDLCALC 141 (H) 03/12/2020   Lab Results  Component Value Date   TRIG 81 03/12/2020   Lab Results  Component Value Date   CHOLHDL 3.6 03/12/2020   No results found for: "HGBA1C"    Assessment & Plan:   Problem List Items Addressed This Visit       Respiratory   Acute bronchitis due to COVID-19 virus    Take antibiotic as prescribed. Increase oral fluids. Pt to f/u if sx worsen and or fail to improve in 2-3 days. rx augmentin 875/125 mg po bid x 10 days       Relevant Medications   amoxicillin-clavulanate (AUGMENTIN) 875-125 MG tablet     Other   Morbid obesity (HCC)    Work on diet and exercise as tolerated      Right foot pain - Primary    Xray today  Ibuprofen prn  Heat/ice to site  Wear supportive shoes      Relevant Orders   DG Foot Complete Right   Elevated LDL cholesterol level    Work on low chol diet  Check lipid panel today Exercise as tolerated      Relevant Orders   Comprehensive metabolic panel   Lipid panel   CBC with Differential/Platelet   Class 3 severe obesity due to excess calories without serious comorbidity with body mass index (BMI) of 45.0 to 49.9 in adult [facility])    Work on diet and exercise       Relevant Orders   Comprehensive metabolic panel   Lipid panel   CBC with Differential/Platelet    Meds ordered this encounter  Medications   amoxicillin-clavulanate (AUGMENTIN) 875-125 MG tablet    Sig: Take 1 tablet by mouth 2 (two) times daily.    Dispense:  20 tablet    Refill:  0    Order Specific Question:    Supervising Provider    Answer:   BEDSOLE, AMY E [[id number]]    Follow-up: No follow-ups on file.    Mort Sawyers, FNP

## 2022-01-12 NOTE — Assessment & Plan Note (Signed)
Work on diet and exercise as tolerated  ?

## 2022-01-12 NOTE — Assessment & Plan Note (Signed)
Work on diet and exercise 

## 2022-01-12 NOTE — Patient Instructions (Signed)
  Welcome to our clinic, I am happy to have you as my new patient. I am excited to continue on this healthcare journey with you.  Stop by the lab prior to leaving today. I will notify you of your results once received.   Please keep in mind Any my chart messages you send have up to a three business day turnaround for a response.  Phone calls may take up to a one full business day turnaround for a  response.   If you need a medication refill I recommend you request it through the pharmacy as this is easiest for us rather than sending a message and or phone call.   Due to recent changes in healthcare laws, you may see results of your imaging and/or laboratory studies on MyChart before I have had a chance to review them.  I understand that in some cases there may be results that are confusing or concerning to you. Please understand that not all results are received at the same time and often I may need to interpret multiple results in order to provide you with the best plan of care or course of treatment. Therefore, I ask that you please give me 2 business days to thoroughly review all your results before contacting my office for clarification. Should we see a critical lab result, you will be contacted sooner.   It was a pleasure seeing you today! Please do not hesitate to reach out with any questions and or concerns.  Regards,   Dajaun Goldring FNP-C  

## 2022-01-13 ENCOUNTER — Other Ambulatory Visit (INDEPENDENT_AMBULATORY_CARE_PROVIDER_SITE_OTHER): Payer: BC Managed Care – PPO

## 2022-01-13 ENCOUNTER — Other Ambulatory Visit: Payer: Self-pay | Admitting: Family

## 2022-01-13 ENCOUNTER — Telehealth: Payer: Self-pay | Admitting: Nurse Practitioner

## 2022-01-13 DIAGNOSIS — D649 Anemia, unspecified: Secondary | ICD-10-CM

## 2022-01-13 LAB — IBC + FERRITIN
Ferritin: 37.3 ng/mL (ref 10.0–291.0)
Iron: 69 ug/dL (ref 42–145)
Saturation Ratios: 20.2 % (ref 20.0–50.0)
TIBC: 341.6 ug/dL (ref 250.0–450.0)
Transferrin: 244 mg/dL (ref 212.0–360.0)

## 2022-01-13 NOTE — Progress Notes (Signed)
Can we add IBC ferritin Added as future order

## 2022-01-13 NOTE — Progress Notes (Signed)
Work on low cholesterol diet LDL is 122, goal less than 100  Slight new anemia, will add iron and see where we are at.  Does pt still get periods are they heavy? Or is postmenopausal?  Not overly worried about anemia.   Lab only x one month for repeat

## 2022-01-13 NOTE — Telephone Encounter (Signed)
Called patient with Xray results.   Advised wearing supportive shoes  Continue RICE as discussed   F/U as discussed    Narrative & Impression  CLINICAL DATA:  3-4 weeks of first metatarsophalangeal joint pain. Swelling yesterday.   EXAM: RIGHT FOOT COMPLETE - 3+ VIEW   COMPARISON:  None Available.   FINDINGS: Minimal lateral great toe metatarsophalangeal joint space narrowing and peripheral degenerative spurring. Mild-to-moderate plantar calcaneal heel spur. Mild chronic spurring at the Achilles insertion on the calcaneus. No acute fracture or dislocation.   IMPRESSION: 1. Minimal lateral great toe metatarsophalangeal joint degenerative changes. 2. Mild-to-moderate plantar calcaneal heel spur.     Electronically Signed   By: Neita Garnet M.D.   On: 01/12/2022 15:58

## 2022-01-17 ENCOUNTER — Encounter: Payer: Self-pay | Admitting: Family

## 2022-02-13 ENCOUNTER — Other Ambulatory Visit (INDEPENDENT_AMBULATORY_CARE_PROVIDER_SITE_OTHER): Payer: BC Managed Care – PPO

## 2022-02-13 DIAGNOSIS — D649 Anemia, unspecified: Secondary | ICD-10-CM

## 2022-02-13 LAB — CBC
HCT: 36.2 % (ref 36.0–46.0)
Hemoglobin: 11.9 g/dL — ABNORMAL LOW (ref 12.0–15.0)
MCHC: 32.9 g/dL (ref 30.0–36.0)
MCV: 82.7 fl (ref 78.0–100.0)
Platelets: 271 10*3/uL (ref 150.0–400.0)
RBC: 4.37 Mil/uL (ref 3.87–5.11)
RDW: 15.5 % (ref 11.5–15.5)
WBC: 5.2 10*3/uL (ref 4.0–10.5)

## 2022-03-06 ENCOUNTER — Ambulatory Visit (INDEPENDENT_AMBULATORY_CARE_PROVIDER_SITE_OTHER): Payer: Self-pay

## 2022-03-06 DIAGNOSIS — Z23 Encounter for immunization: Secondary | ICD-10-CM

## 2022-04-11 DIAGNOSIS — Z6841 Body Mass Index (BMI) 40.0 and over, adult: Secondary | ICD-10-CM | POA: Diagnosis not present

## 2022-04-11 DIAGNOSIS — Z01419 Encounter for gynecological examination (general) (routine) without abnormal findings: Secondary | ICD-10-CM | POA: Diagnosis not present

## 2022-04-11 DIAGNOSIS — Z01411 Encounter for gynecological examination (general) (routine) with abnormal findings: Secondary | ICD-10-CM | POA: Diagnosis not present

## 2022-04-11 DIAGNOSIS — Z124 Encounter for screening for malignant neoplasm of cervix: Secondary | ICD-10-CM | POA: Diagnosis not present

## 2022-04-11 DIAGNOSIS — Z1231 Encounter for screening mammogram for malignant neoplasm of breast: Secondary | ICD-10-CM | POA: Diagnosis not present

## 2022-04-13 ENCOUNTER — Encounter: Payer: Self-pay | Admitting: Family

## 2022-05-02 ENCOUNTER — Encounter: Payer: BC Managed Care – PPO | Admitting: Family

## 2022-05-05 DIAGNOSIS — R7309 Other abnormal glucose: Secondary | ICD-10-CM | POA: Diagnosis not present

## 2022-06-05 DIAGNOSIS — R7309 Other abnormal glucose: Secondary | ICD-10-CM | POA: Diagnosis not present

## 2022-06-13 ENCOUNTER — Ambulatory Visit (INDEPENDENT_AMBULATORY_CARE_PROVIDER_SITE_OTHER): Payer: BC Managed Care – PPO | Admitting: Family

## 2022-06-13 ENCOUNTER — Encounter: Payer: Self-pay | Admitting: Family

## 2022-06-13 VITALS — BP 136/86 | Temp 97.1°F | Ht 70.0 in | Wt 299.2 lb

## 2022-06-13 DIAGNOSIS — Z780 Asymptomatic menopausal state: Secondary | ICD-10-CM

## 2022-06-13 DIAGNOSIS — Z1211 Encounter for screening for malignant neoplasm of colon: Secondary | ICD-10-CM

## 2022-06-13 DIAGNOSIS — D508 Other iron deficiency anemias: Secondary | ICD-10-CM | POA: Diagnosis not present

## 2022-06-13 DIAGNOSIS — Z0000 Encounter for general adult medical examination without abnormal findings: Secondary | ICD-10-CM

## 2022-06-13 DIAGNOSIS — E7800 Pure hypercholesterolemia, unspecified: Secondary | ICD-10-CM

## 2022-06-13 DIAGNOSIS — J309 Allergic rhinitis, unspecified: Secondary | ICD-10-CM | POA: Insufficient documentation

## 2022-06-13 DIAGNOSIS — Z6841 Body Mass Index (BMI) 40.0 and over, adult: Secondary | ICD-10-CM

## 2022-06-13 DIAGNOSIS — J3089 Other allergic rhinitis: Secondary | ICD-10-CM

## 2022-06-13 LAB — LIPID PANEL
Cholesterol: 206 mg/dL — ABNORMAL HIGH (ref 0–200)
HDL: 60.7 mg/dL (ref >39.00)
LDL Cholesterol: 126 mg/dL — ABNORMAL HIGH (ref 0–99)
NonHDL: 144.93
Total CHOL/HDL Ratio: 3
Triglycerides: 93 mg/dL (ref 0.0–149.0)
VLDL: 18.6 mg/dL (ref 0.0–40.0)

## 2022-06-13 NOTE — Assessment & Plan Note (Signed)
Stable

## 2022-06-13 NOTE — Progress Notes (Signed)
Established Patient Office Visit  Subjective:  Patient ID: Rhonda Santiago, female    DOB: 1970-12-03  Age: 52 y.o. MRN: [id number]  CC:  Chief Complaint  Patient presents with   Annual Exam    HPI Rhonda Santiago is here for an annual exam.   Mammogram: 03/2022 (will get me exact date later through mychart) Colonoscopy: cologuard per pt over three years ago. Menses: post menopausal , ceased 2000 Bone density: will consider at her next visit with GYN Vaccinations: due for shingrix, covid bivalent  Shingles vaccination, pt will consider this.  Pap: 03/21/2022 per pt, negative   Dental screenings:  Eye exam:  Skin screening: checked annually by dermatology   chronic concerns:  Hyperlipidemia: working on low chol diet. Tries to exercise. Has been losing weight as well.  Wt Readings from Last 3 Encounters:  06/13/22 299 lb 4 oz (135.7 kg)  01/12/22 (!) 321 lb 8 oz (145.8 kg)  12/26/21 (!) 318 lb 3.2 oz (144.3 kg)    Lab Results  Component Value Date   CHOL 198 01/12/2022   HDL 50.20 01/12/2022   LDLCALC 122 (H) 01/12/2022   LDLDIRECT 137.9 08/01/2011   TRIG 132.0 01/12/2022   CHOLHDL 4 01/12/2022   IDA: stable.   Allergic rhinitis: on zyrtec, stable.   Past Medical History:  Diagnosis Date   Abnormal Pap smear 11/15/2009   ASCUS / colpo 12/15/2009 low grade squamous , CIN1    Biliary colic    Obesity     Past Surgical History:  Procedure Laterality Date   CHOLECYSTECTOMY N/A 07/06/2015   Procedure: LAPAROSCOPIC CHOLECYSTECTOMY WITH INTRAOPERATIVE CHOLANGIOGRAM;  Surgeon: Armandina Gemma, MD;  Location: [place];  Service: General;  Laterality: N/A;   WISDOM TOOTH EXTRACTION      Family History  Problem Relation Age of Onset   Depression Father        suicide   Depression Sister        suicide   Stroke Maternal Grandmother    Heart disease Maternal Grandfather    Dementia Paternal Grandmother    Lung cancer Paternal Grandfather        lung   Brain  cancer Maternal Aunt     Social History   Socioeconomic History   Marital status: Widowed    Spouse name: Not on file   Number of children: 2   Years of education: Not on file   Highest education level: Not on file  Occupational History   Not on file  Tobacco Use   Smoking status: Never   Smokeless tobacco: Never  Vaping Use   Vaping Use: Never used  Substance and Sexual Activity   Alcohol use: Not Currently    Comment: rarely   Drug use: No   Sexual activity: Not Currently    Partners: Male  Other Topics Concern   Not on file  Social History Narrative   Husband committed suicide in 2008 she was 52 y/o Works at [employer].      Son 4    Daughter 5    Social Determinants of Radio broadcast assistant Strain: Not on file  Food Insecurity: Not on file  Transportation Needs: Not on file  Physical Activity: Not on file  Stress: Not on file  Social Connections: Not on file  Intimate Partner Violence: Not on file    Outpatient Medications Prior to Visit  Medication Sig Dispense Refill   cetirizine (ZYRTEC) 10 MG tablet Take 10 mg by  mouth daily.     Multiple Vitamin (MULTI-VITAMINS) TABS Take by mouth.     amoxicillin-clavulanate (AUGMENTIN) 875-125 MG tablet Take 1 tablet by mouth 2 (two) times daily. 20 tablet 0   No facility-administered medications prior to visit.    No Active Allergies  ROS Review of Systems  Review of Systems  Respiratory:  Negative for shortness of breath.   Cardiovascular:  Negative for chest pain and palpitations.  Gastrointestinal:  Negative for constipation and diarrhea.  Genitourinary:  Negative for dysuria, frequency and urgency.  Musculoskeletal:  Negative for myalgias.  Psychiatric/Behavioral:  Negative for depression and suicidal ideas.   All other systems reviewed and are negative.    Objective:    Physical Exam  Gen: NAD, resting comfortably CV: RRR with no murmurs appreciated Pulm: NWOB, CTAB with no crackles, wheezes,  or rhonchi Skin: warm, dry Psych: Normal affect and thought content  BP 136/86 (BP Location: Left Arm, Patient Position: Sitting)   Temp (!) 97.1 F (36.2 C) (Skin)   Ht 5\' 10"  (1.778 m)   Wt 299 lb 4 oz (135.7 kg)   SpO2 98%   BMI 42.94 kg/m  Wt Readings from Last 3 Encounters:  06/13/22 299 lb 4 oz (135.7 kg)  01/12/22 (!) 321 lb 8 oz (145.8 kg)  12/26/21 (!) 318 lb 3.2 oz (144.3 kg)     Health Maintenance Due  Topic Date Due   Zoster Vaccines- Shingrix (1 of 2) Never done   COVID-19 Vaccine (3 - Moderna risk series) 10/03/2019   MAMMOGRAM  03/23/2020   PAP SMEAR-Modifier  03/21/2021   Fecal DNA (Cologuard)  03/11/2022    There are no preventive care reminders to display for this patient.  Lab Results  Component Value Date   TSH 1.430 03/12/2020   Lab Results  Component Value Date   WBC 5.2 02/13/2022   HGB 11.9 (L) 02/13/2022   HCT 36.2 02/13/2022   MCV 82.7 02/13/2022   PLT 271.0 02/13/2022   Lab Results  Component Value Date   NA 140 01/12/2022   K 3.7 01/12/2022   CO2 27 01/12/2022   GLUCOSE 79 01/12/2022   BUN 11 01/12/2022   CREATININE 0.71 01/12/2022   BILITOT 0.3 01/12/2022   ALKPHOS 87 01/12/2022   AST 19 01/12/2022   ALT 21 01/12/2022   PROT 7.0 01/12/2022   ALBUMIN 3.8 01/12/2022   CALCIUM 8.9 01/12/2022   ANIONGAP 7 06/29/2015   GFR 97.36 01/12/2022   Lab Results  Component Value Date   CHOL 198 01/12/2022   Lab Results  Component Value Date   HDL 50.20 01/12/2022   Lab Results  Component Value Date   LDLCALC 122 (H) 01/12/2022   Lab Results  Component Value Date   TRIG 132.0 01/12/2022   Lab Results  Component Value Date   CHOLHDL 4 01/12/2022   No results found for: "HGBA1C"    Assessment & Plan:   Problem List Items Addressed This Visit       Respiratory   Allergic rhinitis due to allergen     Other   Class 3 severe obesity due to excess calories with serious comorbidity and body mass index (BMI) of 40.0 to  44.9 in adult (HCC)   Elevated LDL cholesterol level    Ordered lipid panel, pending results. Work on low cholesterol diet and exercise as tolerated       Relevant Orders   Lipid panel   Iron deficiency anemia secondary to inadequate dietary  iron intake - Primary    Stable.       Encounter for general adult medical examination without abnormal findings    Patient Counseling(The following topics were reviewed):  Preventative care handout given to pt  Health maintenance and immunizations reviewed. Please refer to Health maintenance section. Pt advised on safe sex, wearing seatbelts in car, and proper nutrition labwork ordered today for annual Dental health: Discussed importance of regular tooth brushing, flossing, and dental visits. Pt will consider shingles vaccination and covid vaccination.  Colonoscopy order placed.      Other Visit Diagnoses     Screening for colon cancer       Relevant Orders   Ambulatory referral to Gastroenterology   Postmenopausal       Relevant Orders   DG Bone Density       No orders of the defined types were placed in this encounter.   Follow-up: Return in about 6 months (around 12/12/2022) for f/u cholesterol.    Mort Sawyers, FNP

## 2022-06-13 NOTE — Assessment & Plan Note (Signed)
Ordered lipid panel, pending results. Work on low cholesterol diet and exercise as tolerated ? ?

## 2022-06-13 NOTE — Assessment & Plan Note (Signed)
Patient Counseling(The following topics were reviewed):  Preventative care handout given to pt  Health maintenance and immunizations reviewed. Please refer to Health maintenance section. Pt advised on safe sex, wearing seatbelts in car, and proper nutrition labwork ordered today for annual Dental health: Discussed importance of regular tooth brushing, flossing, and dental visits. Pt will consider shingles vaccination and covid vaccination.  Colonoscopy order placed.

## 2022-06-13 NOTE — Patient Instructions (Signed)
  Consider shingles vaccination  Consider covid vaccination   Stop by the lab prior to leaving today. I will notify you of your results once received.   Recommendations on keeping yourself healthy:  - Exercise at least 30-45 minutes a day, 3-4 days a week.  - Eat a low-fat diet with lots of fruits and vegetables, up to 7-9 servings per day.  - Seatbelts can save your life. Wear them always.  - Smoke detectors on every level of your home, check batteries every year.  - Eye Doctor - have an eye exam every 1-2 years  - Safe sex - if you may be exposed to STDs, use a condom.  - Alcohol -  If you drink, do it moderately, less than 2 drinks per day.  - [place]. Choose someone to speak for you if you are not able.  - Depression is common in our stressful world.If you're feeling down or losing interest in things you normally enjoy, please come in for a visit.  - Violence - If anyone is threatening or hurting you, please call immediately.  Due to recent changes in healthcare laws, you may see results of your imaging and/or laboratory studies on MyChart before I have had a chance to review them.  I understand that in some cases there may be results that are confusing or concerning to you. Please understand that not all results are received at the same time and often I may need to interpret multiple results in order to provide you with the best plan of care or course of treatment. Therefore, I ask that you please give me 2 business days to thoroughly review all your results before contacting my office for clarification. Should we see a critical lab result, you will be contacted sooner.   I will see you again in one year for your annual comprehensive exam unless otherwise stated and or with acute concerns.  It was a pleasure seeing you today! Please do not hesitate to reach out with any questions and or concerns.  Regards,   Eugenia Pancoast

## 2022-06-21 ENCOUNTER — Ambulatory Visit: Payer: Self-pay | Admitting: Adult Health

## 2022-06-22 ENCOUNTER — Ambulatory Visit (INDEPENDENT_AMBULATORY_CARE_PROVIDER_SITE_OTHER): Payer: Self-pay | Admitting: Physician Assistant

## 2022-06-22 ENCOUNTER — Encounter: Payer: Self-pay | Admitting: Physician Assistant

## 2022-06-22 VITALS — BP 116/70 | HR 107 | Temp 98.5°F | Wt 298.2 lb

## 2022-06-22 DIAGNOSIS — J320 Chronic maxillary sinusitis: Secondary | ICD-10-CM

## 2022-06-22 MED ORDER — METHYLPREDNISOLONE 4 MG PO TBPK: ORAL_TABLET | ORAL | 0 refills | Status: DC

## 2022-06-22 NOTE — Progress Notes (Signed)
[facility] [address] [place], [place] [address]   Office Visit Note  Patient Name: Rhonda Santiago Date of Birth [id number]  Medical Record number [id number]  Date of Service: 06/22/2022  Chief Complaint  Patient presents with   Sinusitis    Took COVID this morning that was Neg. Started 6-7 days ago. Sinus pressure, teeth pain. Right ear feels off. Coughing off and on. Mucus is yellow/green. Mostly clear when blowing her nose. No ST, BA, fever.      52 y/o F presents to the clinic for c/o right sided pressure and pain from the sinuses, cough x 6-7 days. She also c/o post nasal drainage. Feels right sided ear fullness/discomfort. She denies fever, chills, CP, SOB, wheezing. Has been taking otc Mucinex which hasn't helped much.   Sinusitis Associated symptoms include congestion, coughing and sinus pressure. Pertinent negatives include no shortness of breath or sore throat.      Current Medication:  Outpatient Encounter Medications as of 06/22/2022  Medication Sig   cetirizine (ZYRTEC) 10 MG tablet Take 10 mg by mouth daily.   methylPREDNISolone (MEDROL DOSEPAK) 4 MG TBPK tablet Take as directed   Multiple Vitamin (MULTI-VITAMINS) TABS Take by mouth.   No facility-administered encounter medications on file as of 06/22/2022.      Medical History: Past Medical History:  Diagnosis Date   Abnormal Pap smear 11/15/2009   ASCUS / colpo 12/15/2009 low grade squamous , CIN1    Biliary colic    Obesity      Vital Signs: BP 116/70 (BP Location: Left Arm, Patient Position: Sitting, Cuff Size: Normal)   Pulse (!) 107   Temp 98.5 F (36.9 C) (Tympanic)   Wt 298 lb 3.2 oz (135.3 kg)   SpO2 99%   BMI 42.79 kg/m    Review of Systems  Constitutional: Negative.   HENT:  Positive for congestion, postnasal drip, sinus pressure and sinus pain. Negative for sore throat and trouble swallowing.   Respiratory:  Positive for cough. Negative for chest tightness, shortness of  breath and wheezing.   Cardiovascular: Negative.   Neurological: Negative.     Physical Exam Constitutional:      Appearance: Normal appearance.  HENT:     Head: Atraumatic.     Right Ear: Tympanic membrane, ear canal and external ear normal.     Left Ear: Tympanic membrane, ear canal and external ear normal.     Nose:     Right Sinus: Maxillary sinus tenderness present.     Left Sinus: No maxillary sinus tenderness.     Mouth/Throat:     Mouth: Mucous membranes are moist.     Pharynx: Oropharynx is clear.  Eyes:     Extraocular Movements: Extraocular movements intact.  Cardiovascular:     Rate and Rhythm: Normal rate and regular rhythm.  Pulmonary:     Effort: Pulmonary effort is normal.     Breath sounds: Normal breath sounds.  Musculoskeletal:     Cervical back: Neck supple.  Skin:    General: Skin is warm.  Neurological:     Mental Status: She is alert.  Psychiatric:        Mood and Affect: Mood normal.        Behavior: Behavior normal.        Thought Content: Thought content normal.        Judgment: Judgment normal.       Assessment/Plan:  1. Right maxillary sinusitis - methylPREDNISolone (MEDROL DOSEPAK)  4 MG TBPK tablet; Take as directed  Dispense: 1 each; Refill: 0  Increase fluids Continue with humidifier Start otc oral decongestant ie Sudafed as directed on the box. If symptoms don't improve then discontinue Sudafed and start oral steroids as prescribed. Briefly discussed oral antibiotic if steroids don't help with sinus symptoms.  Pt verbalized understanding and in agreement.    General Counseling: Rhonda Santiago verbalizes understanding of the findings of todays visit and agrees with plan of treatment. I have discussed any further diagnostic evaluation that may be needed or ordered today. We also reviewed her medications today. she has been encouraged to call the office with any questions or concerns that should arise related to todays visit.    Time  spent:20 [place], [place] Physician Assistant

## 2022-06-26 ENCOUNTER — Ambulatory Visit (INDEPENDENT_AMBULATORY_CARE_PROVIDER_SITE_OTHER): Payer: Self-pay | Admitting: Physician Assistant

## 2022-06-26 DIAGNOSIS — J320 Chronic maxillary sinusitis: Secondary | ICD-10-CM

## 2022-06-26 MED ORDER — AMOXICILLIN-POT CLAVULANATE 875-125 MG PO TABS: 1.0000 | ORAL_TABLET | Freq: Two times a day (BID) | ORAL | 0 refills | Status: AC

## 2022-06-26 NOTE — Progress Notes (Signed)
Virtual Visit Consent   Rhonda Santiago, you are scheduled for a virtual visit with a [facility] provider today. Just as with appointments in the office, your consent must be obtained to participate. Your consent will be active for this visit and any virtual visit you may have with one of our providers in the next 365 days. If you have a MyChart account, a copy of this consent can be sent to you electronically.  As this is a virtual telephone visit which doesn't allow your provider to perform a traditional examination and may limit your provider's ability to fully assess your condition. If your provider identifies any concerns that need to be evaluated in person or the need to arrange testing (such as labs, EKG, etc.), we will make arrangements to do so. Although advances in technology are sophisticated, we cannot ensure that it will always work on either your end or our end. If the connection with a video visit is poor, the visit may have to be switched to a telephone visit. With either a video or telephone visit, we are not always able to ensure that we have a secure connection.  By engaging in this virtual visit, you consent to the provision of healthcare and authorize for your insurance to be billed (if applicable) for the services provided during this visit. Depending on your insurance coverage, you may receive a charge related to this service.  I need to obtain your verbal consent now. Are you willing to proceed with your visit today? Rhonda Santiago has provided verbal consent on 06/26/2022 for a virtual visit (video or telephone). Johnna Acosta, [place]  Date: 06/26/2022 1:51 PM  Virtual Visit via Telephone Note   I, Johnna Acosta, connected with  Rhonda Santiago  ([id number], 24-Jul-1970) on 06/26/22 at 12:30 PM EST by a telephone and verified that I am speaking with the correct person using two identifiers.  Location: Patient: Virtual Visit Location Patient: Home Provider: Virtual Visit  Location Provider: Office/Clinic   I discussed the limitations of evaluation and management by telemedicine and the availability of in person appointments. The patient expressed understanding and agreed to proceed.    History of Present Illness: Rhonda Santiago is a 52 y.o. who identifies as a female who was assigned female at birth, and is being seen today for sinus symptoms.  HPI: 52 y/o F c/o continued sinus pain and pressure via telehealth. She has been taking otc Sudafed and using humidifier. She hasn't picked up a  previously prescribed oral steroids. She had once felt side effects to taking steroids, but second time she had no side effects.     Problems:  Patient Active Problem List   Diagnosis Date Noted   Iron deficiency anemia secondary to inadequate dietary iron intake 06/13/2022   Allergic rhinitis due to allergen 06/13/2022   Elevated LDL cholesterol level 01/12/2022   Class 3 severe obesity due to excess calories with serious comorbidity and body mass index (BMI) of 40.0 to 44.9 in adult [facility]) 01/13/2014    Allergies: No Known Allergies Medications:  Current Outpatient Medications:    amoxicillin-clavulanate (AUGMENTIN) 875-125 MG tablet, Take 1 tablet by mouth 2 (two) times daily for 5 days., Disp: 10 tablet, Rfl: 0   cetirizine (ZYRTEC) 10 MG tablet, Take 10 mg by mouth daily., Disp: , Rfl:    methylPREDNISolone (MEDROL DOSEPAK) 4 MG TBPK tablet, Take as directed, Disp: 1 each, Rfl: 0   Multiple Vitamin (MULTI-VITAMINS) TABS, Take by mouth., Disp: ,  Rfl:   Observations/Objective: Patient is well-developed, well-nourished in no acute distress.  Resting comfortably  at home.  Head is normocephalic, atraumatic.  No labored breathing.  Speech is clear and coherent with logical content.  Patient is alert and oriented at baseline.    Assessment and Plan: 1. Right maxillary sinusitis - amoxicillin-clavulanate (AUGMENTIN) 875-125 MG tablet; Take 1 tablet by mouth 2  (two) times daily for 5 days.  Dispense: 10 tablet; Refill: 0  Recommend to take oral steroids as instructed. If onset of any side effects then she may d/c the medicine. If her symptoms don't feel any better or with worsening symptoms then she can start oral antibiotics as prescribed.  Pt verbalized understanding and in agreement.    Follow Up Instructions: I discussed the assessment and treatment plan with the patient. The patient was provided an opportunity to ask questions and all were answered. The patient agreed with the plan and demonstrated an understanding of the instructions.  A copy of instructions were sent to the patient via MyChart unless otherwise noted below.    The patient was advised to call back or seek an in-person evaluation if the symptoms worsen or if the condition fails to improve as anticipated.  Time:  I spent 10 minutes with the patient via telehealth technology discussing the above problems/concerns.    Johnna Acosta, PA-C

## 2022-07-06 DIAGNOSIS — R7309 Other abnormal glucose: Secondary | ICD-10-CM | POA: Diagnosis not present

## 2022-07-10 ENCOUNTER — Encounter: Payer: Self-pay | Admitting: Family

## 2022-07-10 ENCOUNTER — Telehealth: Payer: Self-pay

## 2022-07-10 DIAGNOSIS — Z1211 Encounter for screening for malignant neoplasm of colon: Secondary | ICD-10-CM

## 2022-07-10 NOTE — Telephone Encounter (Signed)
Returned phone call to patient to schedule colonoscopy.  LVM for pt to return my call.   Thanks, Raoul, [place]

## 2022-07-10 NOTE — Telephone Encounter (Signed)
Patient left a voicemail and is ready to  schedule her colonoscopy. Please call patient to schedule

## 2022-07-10 NOTE — Telephone Encounter (Signed)
Please clarify the details of her needing a bone density done. thanks

## 2022-07-11 NOTE — Telephone Encounter (Signed)
Message sent to mychart

## 2022-07-11 NOTE — Telephone Encounter (Signed)
I ordered bone density 1/9.  Pt needs to call [facility] to get this set up , it was sent electronically.   I have sent an electronic order over to your preferred location for the following:    [x]   [facility]  [place] [address]  [phone]

## 2022-07-12 ENCOUNTER — Other Ambulatory Visit: Payer: Self-pay

## 2022-07-12 DIAGNOSIS — Z1211 Encounter for screening for malignant neoplasm of colon: Secondary | ICD-10-CM

## 2022-07-12 MED ORDER — NA SULFATE-K SULFATE-MG SULF 17.5-3.13-1.6 GM/177ML PO SOLN: 1.0000 | Freq: Once | ORAL | 0 refills | Status: AC

## 2022-07-12 NOTE — Telephone Encounter (Signed)
Gastroenterology Pre-Procedure Review  Request Date: 08/25/22 Requesting Physician: Dr. Marius Ditch  PATIENT REVIEW QUESTIONS: The patient responded to the following health history questions as indicated:    1. Are you having any GI issues? no 2. Do you have a personal history of Polyps? no 3. Do you have a family history of Colon Cancer or Polyps? no 4. Diabetes Mellitus? no 5. Joint replacements in the past 12 months?no 6. Major health problems in the past 3 months?no 7. Any artificial heart valves, MVP, or defibrillator?no    MEDICATIONS & ALLERGIES:    Patient reports the following regarding taking any anticoagulation/antiplatelet therapy:   Plavix, Coumadin, Eliquis, Xarelto, Lovenox, Pradaxa, Brilinta, or Effient? no Aspirin? no  Patient confirms/reports the following medications:  Current Outpatient Medications  Medication Sig Dispense Refill   cetirizine (ZYRTEC) 10 MG tablet Take 10 mg by mouth daily.     methylPREDNISolone (MEDROL DOSEPAK) 4 MG TBPK tablet Take as directed 1 each 0   Multiple Vitamin (MULTI-VITAMINS) TABS Take by mouth.     No current facility-administered medications for this visit.    Patient confirms/reports the following allergies:  No Known Allergies  No orders of the defined types were placed in this encounter.   AUTHORIZATION INFORMATION Primary Insurance: 1D#: Group #:  Secondary Insurance: 1D#: Group #:  SCHEDULE INFORMATION: Date: 08/25/22 Time: Location: [facility]

## 2022-07-12 NOTE — Addendum Note (Signed)
Addended by: Vanetta Mulders on: 07/12/2022 10:51 AM   Modules accepted: Orders

## 2022-07-18 ENCOUNTER — Ambulatory Visit: Payer: BC Managed Care – PPO | Admitting: Internal Medicine

## 2022-07-18 ENCOUNTER — Encounter: Payer: Self-pay | Admitting: Internal Medicine

## 2022-07-18 VITALS — BP 124/82 | HR 91 | Temp 96.7°F | Ht 70.0 in | Wt 298.0 lb

## 2022-07-18 DIAGNOSIS — Z6841 Body Mass Index (BMI) 40.0 and over, adult: Secondary | ICD-10-CM

## 2022-07-18 DIAGNOSIS — E7800 Pure hypercholesterolemia, unspecified: Secondary | ICD-10-CM

## 2022-07-18 DIAGNOSIS — Z1211 Encounter for screening for malignant neoplasm of colon: Secondary | ICD-10-CM

## 2022-07-18 DIAGNOSIS — D508 Other iron deficiency anemias: Secondary | ICD-10-CM

## 2022-07-18 NOTE — Progress Notes (Signed)
HPI  Patient presents to clinic today to establish care and for management of the conditions listed below.  HLD: Her last LDL was 126, triglycerides 93, 06/2022.  She is not taking any cholesterol-lowering medication at this time.  She tries to consume a low-fat diet.  Iron Deficiency Anemia: Her last H/H was 11.9/36.2, 02/2022.  She is not taking any oral Iron at this time.  She does not follow with hematology.  Past Medical History:  Diagnosis Date   Abnormal Pap smear 11/15/2009   ASCUS / colpo 12/15/2009 low grade squamous , CIN1    Biliary colic    Obesity     Current Outpatient Medications  Medication Sig Dispense Refill   cetirizine (ZYRTEC) 10 MG tablet Take 10 mg by mouth daily.     methylPREDNISolone (MEDROL DOSEPAK) 4 MG TBPK tablet Take as directed 1 each 0   Multiple Vitamin (MULTI-VITAMINS) TABS Take by mouth.     No current facility-administered medications for this visit.    No Known Allergies  Family History  Problem Relation Age of Onset   Depression Father        suicide   Depression Sister        suicide   Stroke Maternal Grandmother    Heart disease Maternal Grandfather    Dementia Paternal Grandmother    Lung cancer Paternal Grandfather        lung   Brain cancer Maternal Aunt     Social History   Socioeconomic History   Marital status: Widowed    Spouse name: Not on file   Number of children: 2   Years of education: Not on file   Highest education level: Not on file  Occupational History   Not on file  Tobacco Use   Smoking status: Never   Smokeless tobacco: Never  Vaping Use   Vaping Use: Never used  Substance and Sexual Activity   Alcohol use: Not Currently    Comment: rarely   Drug use: No   Sexual activity: Not Currently    Partners: Male  Other Topics Concern   Not on file  Social History Narrative   Husband committed suicide in 2008 she was 52 y/o Works at [employer].      Son 31    Daughter 16    Social Determinants of Adult nurse Strain: Not on file  Food Insecurity: Not on file  Transportation Needs: Not on file  Physical Activity: Not on file  Stress: Not on file  Social Connections: Not on file  Intimate Partner Violence: Not on file    ROS:  Constitutional: Denies fever, malaise, fatigue, headache or abrupt weight changes.  HEENT: Denies eye pain, eye redness, ear pain, ringing in the ears, wax buildup, runny nose, nasal congestion, bloody nose, or sore throat. Respiratory: Denies difficulty breathing, shortness of breath, cough or sputum production.   Cardiovascular: Denies chest pain, chest tightness, palpitations or swelling in the hands or feet.  Gastrointestinal: Denies abdominal pain, bloating, constipation, diarrhea or blood in the stool.  GU: Denies frequency, urgency, pain with urination, blood in urine, odor or discharge. Musculoskeletal: Denies decrease in range of motion, difficulty with gait, muscle pain or joint pain and swelling.  Skin: Denies redness, rashes, lesions or ulcercations.  Neurological: Denies dizziness, difficulty with memory, difficulty with speech or problems with balance and coordination.  Psych: Denies anxiety, depression, SI/HI.  No other specific complaints in a complete review of systems (except as listed  in HPI above).  BP 124/82 (BP Location: Left Arm, Patient Position: Sitting, Cuff Size: Large)   Pulse 91   Temp (!) 96.7 F (35.9 C) (Temporal)   Ht 5' 10"$  (1.778 m)   Wt 298 lb (135.2 kg)   SpO2 99%   BMI 42.76 kg/m   Wt Readings from Last 3 Encounters:  06/22/22 298 lb 3.2 oz (135.3 kg)  06/13/22 299 lb 4 oz (135.7 kg)  01/12/22 (!) 321 lb 8 oz (145.8 kg)    General: Appears her stated age, obese, in NAD. Skin: Lipoma noted of left ankle. HEENT: Head: normal shape and size; Eyes: sclera white, no icterus, conjunctiva pink, PERRLA and EOMs intact;  Cardiovascular: Normal rate and rhythm. S1,S2 noted.  No murmur, rubs or gallops noted.  No JVD or BLE edema. No carotid bruits noted. Pulmonary/Chest: Normal effort and positive vesicular breath sounds. No respiratory distress. No wheezes, rales or ronchi noted.  Musculoskeletal: No difficulty with gait.  Neurological: Alert and oriented. Coordination normal.  Psychiatric: Mood and affect normal. Behavior is normal. Judgment and thought content normal.     BMET    Component Value Date/Time   NA 140 01/12/2022 1037   NA 139 03/12/2020 0848   K 3.7 01/12/2022 1037   CL 102 01/12/2022 1037   CO2 27 01/12/2022 1037   GLUCOSE 79 01/12/2022 1037   BUN 11 01/12/2022 1037   BUN 11 03/12/2020 0848   CREATININE 0.71 01/12/2022 1037   CALCIUM 8.9 01/12/2022 1037   GFRNONAA 84 03/12/2020 0848   GFRAA 97 03/12/2020 0848    Lipid Panel     Component Value Date/Time   CHOL 206 (H) 06/13/2022 0911   CHOL 215 (H) 03/12/2020 0848   TRIG 93.0 06/13/2022 0911   HDL 60.70 06/13/2022 0911   HDL 60 03/12/2020 0848   CHOLHDL 3 06/13/2022 0911   VLDL 18.6 06/13/2022 0911   LDLCALC 126 (H) 06/13/2022 0911   LDLCALC 141 (H) 03/12/2020 0848    CBC    Component Value Date/Time   WBC 5.2 02/13/2022 0803   RBC 4.37 02/13/2022 0803   HGB 11.9 (L) 02/13/2022 0803   HGB 13.2 03/12/2020 0848   HCT 36.2 02/13/2022 0803   HCT 40.4 03/12/2020 0848   PLT 271.0 02/13/2022 0803   PLT 287 03/12/2020 0848   MCV 82.7 02/13/2022 0803   MCV 86 03/12/2020 0848   MCH 28.2 03/12/2020 0848   MCH 26.5 06/29/2015 1134   MCHC 32.9 02/13/2022 0803   RDW 15.5 02/13/2022 0803   RDW 13.3 03/12/2020 0848   LYMPHSABS 1.8 01/12/2022 1037   LYMPHSABS 1.5 03/12/2020 0848   MONOABS 0.5 01/12/2022 1037   EOSABS 0.1 01/12/2022 1037   EOSABS 0.1 03/12/2020 0848   BASOSABS 0.0 01/12/2022 1037   BASOSABS 0.1 03/12/2020 0848    Hgb A1C No results found for: "HGBA1C"   Assessment and Plan:    RTC in 6 months for your annual exam Webb Silversmith, NP

## 2022-07-18 NOTE — Assessment & Plan Note (Signed)
Encouraged diet and exercise for weight loss ?

## 2022-07-18 NOTE — Assessment & Plan Note (Signed)
CBC reviewed 

## 2022-07-18 NOTE — Assessment & Plan Note (Signed)
Encouraged her to consume a low fat diet

## 2022-07-18 NOTE — Patient Instructions (Signed)

## 2022-08-04 DIAGNOSIS — R7309 Other abnormal glucose: Secondary | ICD-10-CM | POA: Diagnosis not present

## 2022-08-16 ENCOUNTER — Telehealth: Payer: Self-pay

## 2022-08-16 NOTE — Telephone Encounter (Signed)
Patient call has been returned to reschedule her colonoscopy.  She said it is such a busy time for her at work right now she would like to call back to reschedule her colonoscopy.  Vikki in endo has been informed of cancellation.  Thanks, Carlos, [place]

## 2022-08-16 NOTE — Telephone Encounter (Signed)
Patient is calling to reschedule colonoscopy that is schedule for 08/25/2022. She is wanting a call back to reschedule but will be in a meeting where she can not pick up her phone from 1-2 today.

## 2022-08-25 ENCOUNTER — Ambulatory Visit
Admission: RE | Admit: 2022-08-25 | Payer: BC Managed Care – PPO | Source: Home / Self Care | Admitting: Gastroenterology

## 2022-08-25 ENCOUNTER — Encounter: Admission: RE | Payer: Self-pay | Source: Home / Self Care

## 2022-08-25 SURGERY — COLONOSCOPY WITH PROPOFOL
Anesthesia: General

## 2022-08-29 ENCOUNTER — Other Ambulatory Visit: Payer: BC Managed Care – PPO

## 2022-09-04 DIAGNOSIS — R7309 Other abnormal glucose: Secondary | ICD-10-CM | POA: Diagnosis not present

## 2022-09-20 DIAGNOSIS — L858 Other specified epidermal thickening: Secondary | ICD-10-CM | POA: Diagnosis not present

## 2022-09-20 DIAGNOSIS — L578 Other skin changes due to chronic exposure to nonionizing radiation: Secondary | ICD-10-CM | POA: Diagnosis not present

## 2022-09-20 DIAGNOSIS — D2371 Other benign neoplasm of skin of right lower limb, including hip: Secondary | ICD-10-CM | POA: Diagnosis not present

## 2022-09-20 DIAGNOSIS — L700 Acne vulgaris: Secondary | ICD-10-CM | POA: Diagnosis not present

## 2022-10-04 DIAGNOSIS — R7309 Other abnormal glucose: Secondary | ICD-10-CM | POA: Diagnosis not present

## 2022-10-09 ENCOUNTER — Ambulatory Visit
Admission: RE | Admit: 2022-10-09 | Discharge: 2022-10-09 | Disposition: A | Discharge status: Home / Self Care | Payer: BC Managed Care – PPO | Source: Ambulatory Visit | Attending: Family | Admitting: Family

## 2022-10-09 DIAGNOSIS — Z780 Asymptomatic menopausal state: Secondary | ICD-10-CM | POA: Insufficient documentation

## 2022-10-23 ENCOUNTER — Ambulatory Visit (INDEPENDENT_AMBULATORY_CARE_PROVIDER_SITE_OTHER): Payer: Self-pay | Admitting: Physician Assistant

## 2022-10-23 VITALS — BP 128/82 | HR 88 | Temp 97.5°F | Resp 16 | Wt 284.0 lb

## 2022-10-23 DIAGNOSIS — S80861A Insect bite (nonvenomous), right lower leg, initial encounter: Secondary | ICD-10-CM

## 2022-10-23 DIAGNOSIS — W57XXXA Bitten or stung by nonvenomous insect and other nonvenomous arthropods, initial encounter: Secondary | ICD-10-CM

## 2022-10-23 MED ORDER — DOXYCYCLINE HYCLATE 100 MG PO TABS: 100.0000 mg | ORAL_TABLET | Freq: Two times a day (BID) | ORAL | 0 refills | Status: DC

## 2022-10-23 NOTE — Progress Notes (Signed)
[facility] [address] [place], [place] [address]   Office Visit Note  Patient Name: Rhonda Santiago Date of Birth [id number]  Medical Record number [id number]  Date of Service: 10/23/2022  Chief Complaint  Patient presents with   Insect Bite     52 y/o F presents to the clinic for c/o a tick bite x 8 days ago. She removed the whole tick 1-2 days after, however now with a rash. She denies CP, SOB, joint pain.       Current Medication:  Outpatient Encounter Medications as of 10/23/2022  Medication Sig   doxycycline (VIBRA-TABS) 100 MG tablet Take 1 tablet (100 mg total) by mouth 2 (two) times daily.   cetirizine (ZYRTEC) 10 MG tablet Take 10 mg by mouth daily.   Multiple Vitamin (MULTI-VITAMINS) TABS Take by mouth.   No facility-administered encounter medications on file as of 10/23/2022.      Medical History: Past Medical History:  Diagnosis Date   Abnormal Pap smear 11/15/2009   ASCUS / colpo 12/15/2009 low grade squamous , CIN1    Biliary colic    Obesity      Vital Signs: BP 128/82 (BP Location: Left Arm, Patient Position: Sitting, Cuff Size: Normal)   Pulse 88   Temp (!) 97.5 F (36.4 C) (Tympanic)   Resp 16   Wt 284 lb (128.8 kg)   SpO2 97%   BMI 40.75 kg/m    Review of Systems  Constitutional: Negative.   Skin:  Positive for rash.    Physical Exam Constitutional:      Appearance: Normal appearance.  HENT:     Head: Normocephalic and atraumatic.     Right Ear: External ear normal.     Left Ear: External ear normal.  Eyes:     Extraocular Movements: Extraocular movements intact.  Skin:    General: Skin is warm and dry.     Findings: Rash present. Rash is macular. Rash is not crusting, pustular, urticarial or vesicular.          Comments: A small macular rash on the lower third of the right lower extremity. No EM rash currently.   Neurological:     Mental Status: She is alert and oriented to person, place, and time.   Psychiatric:        Mood and Affect: Mood normal.        Behavior: Behavior normal.       Assessment/Plan:  1. Tick bite of right lower leg, initial encounter - doxycycline (VIBRA-TABS) 100 MG tablet; Take 1 tablet (100 mg total) by mouth 2 (two) times daily.  Dispense: 20 tablet; Refill: 0  Start medicine as prescribed. Will continue to monitor her symptoms. Recommended to wait to do Lyme disease blood work until 4-6 weeks after bite. She verbalized understanding. RTC prn Pt verbalized understanding and in agreement.   General Counseling: Rhonda Santiago verbalizes understanding of the findings of todays visit and agrees with plan of treatment. I have discussed any further diagnostic evaluation that may be needed or ordered today. We also reviewed her medications today. she has been encouraged to call the office with any questions or concerns that should arise related to todays visit.    Time spent:20 Minutes    Gilberto Better, [place] Physician Assistant

## 2022-10-27 ENCOUNTER — Ambulatory Visit: Payer: BC Managed Care – PPO | Admitting: Internal Medicine

## 2022-10-27 ENCOUNTER — Encounter: Payer: Self-pay | Admitting: Internal Medicine

## 2022-10-27 VITALS — BP 124/72 | HR 85 | Temp 95.9°F | Wt 283.0 lb

## 2022-10-27 DIAGNOSIS — S80862D Insect bite (nonvenomous), left lower leg, subsequent encounter: Secondary | ICD-10-CM | POA: Diagnosis not present

## 2022-10-27 DIAGNOSIS — L089 Local infection of the skin and subcutaneous tissue, unspecified: Secondary | ICD-10-CM

## 2022-10-27 NOTE — Progress Notes (Signed)
Subjective:    Patient ID: Rhonda Santiago, female    DOB: 11/27/1970, 52 y.o.   MRN: [id number]  HPI  Patient presents to clinic today with complaint of a tick bite to her right lower extremity.  She reports this occurred 12 days ago.  She reports she remove the tick immediately.  She subsequently developed a rash to the right lower extremity.  She saw employee health for the same 5/20 and was started on Doxycycline.  Since that time, she reports she rash seems to be improving. She denies headaches, confusion, vomiting, joint pain, or diarrhea.  Review of Systems     Past Medical History:  Diagnosis Date   Abnormal Pap smear 11/15/2009   ASCUS / colpo 12/15/2009 low grade squamous , CIN1    Biliary colic    Obesity     Current Outpatient Medications  Medication Sig Dispense Refill   cetirizine (ZYRTEC) 10 MG tablet Take 10 mg by mouth daily.     doxycycline (VIBRA-TABS) 100 MG tablet Take 1 tablet (100 mg total) by mouth 2 (two) times daily. 20 tablet 0   Multiple Vitamin (MULTI-VITAMINS) TABS Take by mouth.     No current facility-administered medications for this visit.    No Known Allergies  Family History  Problem Relation Age of Onset   Depression Father        suicide   Depression Sister        suicide   Stroke Maternal Grandmother    Heart disease Maternal Grandfather    Dementia Paternal Grandmother    Lung cancer Paternal Grandfather        lung   Brain cancer Maternal Aunt     Social History   Socioeconomic History   Marital status: Widowed    Spouse name: Not on file   Number of children: 2   Years of education: Not on file   Highest education level: Not on file  Occupational History   Not on file  Tobacco Use   Smoking status: Never   Smokeless tobacco: Never  Vaping Use   Vaping Use: Never used  Substance and Sexual Activity   Alcohol use: Not Currently    Comment: rarely   Drug use: No   Sexual activity: Not Currently    Partners: Male   Other Topics Concern   Not on file  Social History Narrative   Husband committed suicide in 2008 she was 52 y/o Works at [employer].      Son 44    Daughter 22    Social Determinants of Corporate investment banker Strain: Not on file  Food Insecurity: Not on file  Transportation Needs: Not on file  Physical Activity: Not on file  Stress: Not on file  Social Connections: Not on file  Intimate Partner Violence: Not on file     Constitutional: Denies fever, malaise, fatigue, headache or abrupt weight changes.  Respiratory: Denies difficulty breathing, shortness of breath, cough or sputum production.   Cardiovascular: Denies chest pain, chest tightness, palpitations or swelling in the hands or feet.  Gastrointestinal: Denies abdominal pain, bloating, constipation, diarrhea or blood in the stool.  Musculoskeletal: Denies decrease in range of motion, difficulty with gait, muscle pain or joint pain and swelling.  Skin: Patient reports rash to right lower extremity.  Denies lesions or ulcercations.  Neurological: Denies dizziness, difficulty with memory, difficulty with speech or problems with balance and coordination.  Psych: Denies anxiety, depression, SI/HI.  No other  specific complaints in a complete review of systems (except as listed in HPI above).  Objective:   Physical Exam  BP 124/72 (BP Location: Left Arm, Patient Position: Sitting, Cuff Size: Large)   Pulse 85   Temp (!) 95.9 F (35.5 C) (Temporal)   Wt 283 lb (128.4 kg)   SpO2 98%   BMI 40.61 kg/m   Wt Readings from Last 3 Encounters:  10/23/22 284 lb (128.8 kg)  07/18/22 298 lb (135.2 kg)  06/22/22 298 lb 3.2 oz (135.3 kg)    General: Appears her stated age, obese, in NAD. Skin: 2 cm irregular shaped macular rash noted to the right medial shin. Cardiovascular: Normal rate and rhythm.  Pulmonary/Chest: Normal effort and positive vesicular breath sounds. No respiratory distress. No wheezes, rales or ronchi noted.   Musculoskeletal: No difficulty with gait.  Neurological: Alert and oriented.     BMET    Component Value Date/Time   NA 140 01/12/2022 1037   NA 139 03/12/2020 0848   K 3.7 01/12/2022 1037   CL 102 01/12/2022 1037   CO2 27 01/12/2022 1037   GLUCOSE 79 01/12/2022 1037   BUN 11 01/12/2022 1037   BUN 11 03/12/2020 0848   CREATININE 0.71 01/12/2022 1037   CALCIUM 8.9 01/12/2022 1037   GFRNONAA 84 03/12/2020 0848   GFRAA 97 03/12/2020 0848    Lipid Panel     Component Value Date/Time   CHOL 206 (H) 06/13/2022 0911   CHOL 215 (H) 03/12/2020 0848   TRIG 93.0 06/13/2022 0911   HDL 60.70 06/13/2022 0911   HDL 60 03/12/2020 0848   CHOLHDL 3 06/13/2022 0911   VLDL 18.6 06/13/2022 0911   LDLCALC 126 (H) 06/13/2022 0911   LDLCALC 141 (H) 03/12/2020 0848    CBC    Component Value Date/Time   WBC 5.2 02/13/2022 0803   RBC 4.37 02/13/2022 0803   HGB 11.9 (L) 02/13/2022 0803   HGB 13.2 03/12/2020 0848   HCT 36.2 02/13/2022 0803   HCT 40.4 03/12/2020 0848   PLT 271.0 02/13/2022 0803   PLT 287 03/12/2020 0848   MCV 82.7 02/13/2022 0803   MCV 86 03/12/2020 0848   MCH 28.2 03/12/2020 0848   MCH 26.5 06/29/2015 1134   MCHC 32.9 02/13/2022 0803   RDW 15.5 02/13/2022 0803   RDW 13.3 03/12/2020 0848   LYMPHSABS 1.8 01/12/2022 1037   LYMPHSABS 1.5 03/12/2020 0848   MONOABS 0.5 01/12/2022 1037   EOSABS 0.1 01/12/2022 1037   EOSABS 0.1 03/12/2020 0848   BASOSABS 0.0 01/12/2022 1037   BASOSABS 0.1 03/12/2020 0848    Hgb A1C No results found for: "HGBA1C"          Assessment & Plan:   Tick Bite of RLE with Local Skin Infection:  Continue Doxycyline Tick was not on her for > 24 hours which greatly reduces the risks of Lyme's, RMSF and Alpha Gal Monitor for increased pain, redness, swelling, discharge, headaches, confusion, joint pain or diarrhea   RTC in 3 months for annual exam Nicki Reaper, NP

## 2022-10-27 NOTE — Patient Instructions (Signed)
Tick Bite Information, Adult  Ticks are insects that can bite. Most ticks live in bushes and grassy areas. They climb onto people and animals that go by. Then they bite. Some ticks carry germs that can make you sick. How can I prevent tick bites? Take these steps: Before you go outside: Wear long sleeves and long pants. Wear light-colored clothes. Tuck your pant legs into your socks. Use an insect repellent that has 20% or higher of the ingredients DEET, picaridin, or IR3535. Follow the instructions on the label. Put it on: Bare skin. Avoid your eyes and mouth areas. The tops of your boots. Your pant legs. The ends of your sleeves. If you use an insect repellent that has the ingredient permethrin, follow the instructions on the label. Do not put permethrin on the skin. Put it on: Clothing. Shoes. Outdoor gear. Tents. When you are outside Avoid walking through long grass. Stay in the middle of the trail. Do not touch the bushes. Check for ticks on your clothes, hair, and skin often while you are outside. Check again before you go inside. When you go indoors Check your clothes for ticks. Dry your clothes in a dryer on high heat for 10 minutes or more. If clothes are damp, more time may be needed. Wash your clothes right away if they need to be washed. Use hot water. Check your pets and outdoor gear. Shower right away. Check your body for ticks. Do a full body check using a mirror. Check your clothes, skin, head, neck, armpits, waist, groin, and joint areas. What is the right way to remove a tick? Remove the tick from your skin as soon as possible. Do not remove the tick with your bare fingers. Do not try to remove a tick with heat, alcohol, petroleum jelly, or fingernail polish. To remove a tick that is crawling on your skin: Go outside and brush the tick off. Use tape or a lint roller. To remove a tick that is biting: Wash your hands. If you have gloves, put them on. Use tweezers,  curved forceps, or a tick-removal tool to grasp the tick. Grasp the tick as close to your skin and as close to the tick's head as possible. Gently pull up until the tick lets go. Try to keep the tick's head attached to its body. Do not twist or jerk the tick. Do not squeeze or crush the tick. What should I do after taking out a tick? Clean the bite area and your hands with soap and water, rubbing alcohol, or an iodine wash. If you have an antiseptic cream or ointment, put a small amount on the bite area. Wash and disinfect any instruments that you used to remove the tick. How should I get rid of a live tick? To get rid of a live tick, use one of these methods: Place the tick in rubbing alcohol. Place it in a bag or container you can close tightly. Throw it away. Wrap it tightly in tape. Throw it away. Flush it down the toilet. Where to find more information Centers for Disease Control and Prevention: GyrateAtrophy.si U.S. Environmental Protection Agency: RelocationNetworking.fi Contact a doctor if: You have a fever or chills. You have a red rash that makes a circle (bull's-eye rash) in the bite area. You have redness and swelling where the tick bit you. You have a headache or stiff neck. You have pain in a muscle, joint, or bone. You are more tired than normal. You have trouble walking or  moving your legs. You have numbness in your legs. You have tender or swollen lymph glands. You have belly (abdominal) pain, vomiting, watery poop (diarrhea), or weight loss. Get help right away if: You cannot remove a tick. You cannot move (have paralysis) or feel weak. You are feeling worse or have new symptoms. You find a tick that is biting you and filled with blood, especially if you are in an area where diseases from ticks are common. Summary Ticks may carry germs that can make you sick. To prevent tick bites wear long sleeves, long pants, and light colors. Use insect repellent. Follow the  instructions on the label. If the tick is biting, remove it right away. Do not try to remove it with heat, alcohol, petroleum jelly, or fingernail polish. Contact a doctor if you have symptoms of a disease after being bitten by a tick. This information is not intended to replace advice given to you by your health care provider. Make sure you discuss any questions you have with your health care provider. Document Revised: 08/22/2021 Document Reviewed: 08/22/2021 Elsevier Patient Education  2024 [employer].

## 2022-11-04 DIAGNOSIS — R7309 Other abnormal glucose: Secondary | ICD-10-CM | POA: Diagnosis not present

## 2022-12-04 DIAGNOSIS — R7309 Other abnormal glucose: Secondary | ICD-10-CM | POA: Diagnosis not present

## 2023-01-04 DIAGNOSIS — R7309 Other abnormal glucose: Secondary | ICD-10-CM | POA: Diagnosis not present

## 2023-01-29 ENCOUNTER — Encounter: Payer: BC Managed Care – PPO | Admitting: Internal Medicine

## 2023-02-04 DIAGNOSIS — R7309 Other abnormal glucose: Secondary | ICD-10-CM | POA: Diagnosis not present

## 2023-02-23 ENCOUNTER — Ambulatory Visit: Payer: BC Managed Care – PPO | Admitting: Family

## 2023-02-23 ENCOUNTER — Encounter: Payer: Self-pay | Admitting: Family

## 2023-02-23 ENCOUNTER — Telehealth: Payer: Self-pay | Admitting: Internal Medicine

## 2023-02-23 VITALS — BP 128/74 | HR 70 | Temp 97.3°F | Ht 70.0 in | Wt 276.0 lb

## 2023-02-23 DIAGNOSIS — R002 Palpitations: Secondary | ICD-10-CM | POA: Diagnosis not present

## 2023-02-23 LAB — COMPREHENSIVE METABOLIC PANEL
ALT: 14 U/L (ref 0–35)
AST: 19 U/L (ref 0–37)
Albumin: 4.1 g/dL (ref 3.5–5.2)
Alkaline Phosphatase: 96 U/L (ref 39–117)
BUN: 11 mg/dL (ref 6–23)
CO2: 25 mEq/L (ref 19–32)
Calcium: 9.4 mg/dL (ref 8.4–10.5)
Chloride: 103 mEq/L (ref 96–112)
Creatinine, Ser: 0.71 mg/dL (ref 0.40–1.20)
GFR: 96.61 mL/min (ref >60.00)
Glucose, Bld: 83 mg/dL (ref 70–99)
Potassium: 4 mEq/L (ref 3.5–5.1)
Sodium: 137 mEq/L (ref 135–145)
Total Bilirubin: 0.5 mg/dL (ref 0.2–1.2)
Total Protein: 7.5 g/dL (ref 6.0–8.3)

## 2023-02-23 LAB — CBC WITH DIFFERENTIAL/PLATELET
Basophils Absolute: 0 10*3/uL (ref 0.0–0.1)
Basophils Relative: 0.8 % (ref 0.0–3.0)
Eosinophils Absolute: 0 10*3/uL (ref 0.0–0.7)
Eosinophils Relative: 0.7 % (ref 0.0–5.0)
HCT: 40.1 % (ref 36.0–46.0)
Hemoglobin: 12.9 g/dL (ref 12.0–15.0)
Lymphocytes Relative: 34.4 % (ref 12.0–46.0)
Lymphs Abs: 2 10*3/uL (ref 0.7–4.0)
MCHC: 32.2 g/dL (ref 30.0–36.0)
MCV: 85.3 fl (ref 78.0–100.0)
Monocytes Absolute: 0.4 10*3/uL (ref 0.1–1.0)
Monocytes Relative: 7.6 % (ref 3.0–12.0)
Neutro Abs: 3.3 10*3/uL (ref 1.4–7.7)
Neutrophils Relative %: 56.5 % (ref 43.0–77.0)
Platelets: 296 10*3/uL (ref 150.0–400.0)
RBC: 4.7 Mil/uL (ref 3.87–5.11)
RDW: 15 % (ref 11.5–15.5)
WBC: 5.9 10*3/uL (ref 4.0–10.5)

## 2023-02-23 NOTE — Telephone Encounter (Signed)
Per appt notes pt already has appt to see Worthy Rancher FNP 02/23/23 at 10:15. Sending note to Fulton Mole FNP. Thank you.

## 2023-02-23 NOTE — Telephone Encounter (Signed)
Pt called today and stated her heart has been beating fast . And felt different . I than proceed to get the pt transferred to access nurse

## 2023-02-23 NOTE — Progress Notes (Signed)
Acute Office Visit  Subjective:     Patient ID: Rhonda Santiago, female    DOB: 22-Apr-1971, 52 y.o.   MRN: [id number]  Chief Complaint  Patient presents with  . Palpitations    For 2-3 weeks patients has noticed heart racing    HPI 52 year old female presents today, patient of Limmie Patricia, is in with heart palpitations off and on for the past 2 to 3 weeks.  Reports the episodes last about 2 to 3 minutes and then they go away.  Has an average heart rate of 65-95.  She has been monitoring her heart rate with her watch.  She denies any increase in caffeine intake, reports she tries to sleep well but has not over the last week, has stress but no more than usual, no new supplements.  She has a mother who has a history of atrial fibrillation and recently diagnosed with thyroid dysfunction.  Patient denies any chest pain, shortness of breath during these episodes.  Review of Systems  Respiratory:  Negative for shortness of breath.   Cardiovascular:  Positive for palpitations. Negative for chest pain.  All other systems reviewed and are negative. Past Medical History:  Diagnosis Date  . Abnormal Pap smear 11/15/2009   ASCUS / colpo 12/15/2009 low grade squamous , CIN1   . Biliary colic   . Obesity     Social History   Socioeconomic History  . Marital status: Widowed    Spouse name: Not on file  . Number of children: 2  . Years of education: Not on file  . Highest education level: Not on file  Occupational History  . Not on file  Tobacco Use  . Smoking status: Never  . Smokeless tobacco: Never  Vaping Use  . Vaping status: Never Used  Substance and Sexual Activity  . Alcohol use: Not Currently    Comment: rarely  . Drug use: No  . Sexual activity: Not Currently    Partners: Male  Other Topics Concern  . Not on file  Social History Narrative   Husband committed suicide in 2008 she was 52 y/o Works at [employer].      Son 14    Daughter 22    Social Determinants of Manufacturing engineer Strain: Not on file  Food Insecurity: Not on file  Transportation Needs: Not on file  Physical Activity: Not on file  Stress: Not on file  Social Connections: Not on file  Intimate Partner Violence: Not on file    Past Surgical History:  Procedure Laterality Date  . CHOLECYSTECTOMY N/A 07/06/2015   Procedure: LAPAROSCOPIC CHOLECYSTECTOMY WITH INTRAOPERATIVE CHOLANGIOGRAM;  Surgeon: Darnell Level, MD;  Location: [facility] OR;  Service: General;  Laterality: N/A;  . WISDOM TOOTH EXTRACTION      Family History  Problem Relation Age of Onset  . Depression Father        suicide  . Depression Sister        suicide  . Stroke Maternal Grandmother   . Heart disease Maternal Grandfather   . Dementia Paternal Grandmother   . Lung cancer Paternal Grandfather        lung  . Brain cancer Maternal Aunt     No Known Allergies  Current Outpatient Medications on File Prior to Visit  Medication Sig Dispense Refill  . cetirizine (ZYRTEC) 10 MG tablet Take 10 mg by mouth daily.    . Multiple Vitamin (MULTI-VITAMINS) TABS Take by mouth.    . doxycycline (  VIBRA-TABS) 100 MG tablet Take 1 tablet (100 mg total) by mouth 2 (two) times daily. (Patient not taking: Reported on 02/23/2023) 20 tablet 0   No current facility-administered medications on file prior to visit.    BP 128/74   Pulse 70   Temp (!) 97.3 F (36.3 C) (Temporal)   Ht 5\' 10"  (1.778 m)   Wt 276 lb (125.2 kg)   SpO2 99%   BMI 39.60 kg/m chart      Objective:    BP 128/74   Pulse 70   Temp (!) 97.3 F (36.3 C) (Temporal)   Ht 5\' 10"  (1.778 m)   Wt 276 lb (125.2 kg)   SpO2 99%   BMI 39.60 kg/m    Physical Exam Vitals and nursing note reviewed.  Constitutional:      Appearance: Normal appearance. She is obese.     Comments: Down 45 pounds  HENT:     Mouth/Throat:     Mouth: Mucous membranes are moist.  Cardiovascular:     Rate and Rhythm: Normal rate and regular rhythm.  Pulmonary:     Effort:  Pulmonary effort is normal.     Breath sounds: Normal breath sounds.  Abdominal:     General: Abdomen is flat. Bowel sounds are normal.     Palpations: Abdomen is soft.     Tenderness: There is no abdominal tenderness. There is no guarding or rebound.  Musculoskeletal:        General: Normal range of motion.     Cervical back: Normal range of motion.  Skin:    General: Skin is warm and dry.  Neurological:     General: No focal deficit present.     Mental Status: She is alert and oriented to person, place, and time. Mental status is at baseline.  Psychiatric:        Mood and Affect: Mood normal.        Behavior: Behavior normal.   No results found for any visits on 02/23/23.      Assessment & Plan:   Problem List Items Addressed This Visit   None Visit Diagnoses     Palpitations    -  Primary   Relevant Orders   EKG 12-Lead (Completed)   Thyroid Panel With TSH   CMP   CBC w/Diff   Holter monitor - 48 hour       No orders of the defined types were placed in this encounter. Labs obtained today to rule out thyroid dysfunction and electrolyte imbalance.  EKG today looks great.  Will continue with Holter monitor to see if we can determine with these events may be.  Will follow-up pending labs and sooner as needed.  Patient verbalized understanding of the plan.  No follow-ups on file.  Eulis Foster, FNP

## 2023-02-23 NOTE — Telephone Encounter (Signed)
Patient called in and stated that she access informed her of home care and to see her pcp. Scheduled to see Worthy Rancher today for heart palpitations.

## 2023-02-24 LAB — THYROID PANEL WITH TSH
Free Thyroxine Index: 2.3 (ref 1.4–3.8)
T3 Uptake: 29 % (ref 22–35)
T4, Total: 8 ug/dL (ref 5.1–11.9)
TSH: 1.12 mIU/L

## 2023-02-27 ENCOUNTER — Ambulatory Visit: Payer: BC Managed Care – PPO | Admitting: Family

## 2023-02-28 ENCOUNTER — Encounter: Payer: Self-pay | Admitting: Family

## 2023-03-06 ENCOUNTER — Ambulatory Visit: Payer: BC Managed Care – PPO | Admitting: Family

## 2023-03-06 DIAGNOSIS — R7309 Other abnormal glucose: Secondary | ICD-10-CM | POA: Diagnosis not present

## 2023-03-08 ENCOUNTER — Ambulatory Visit (INDEPENDENT_AMBULATORY_CARE_PROVIDER_SITE_OTHER): Payer: Self-pay

## 2023-03-08 DIAGNOSIS — Z23 Encounter for immunization: Secondary | ICD-10-CM

## 2023-03-12 ENCOUNTER — Ambulatory Visit: Payer: BC Managed Care – PPO | Admitting: Family

## 2023-03-14 ENCOUNTER — Ambulatory Visit: Payer: BC Managed Care – PPO | Admitting: Family

## 2023-03-14 VITALS — BP 112/82 | HR 94 | Temp 97.9°F | Ht 70.0 in | Wt 272.4 lb

## 2023-03-14 DIAGNOSIS — R0689 Other abnormalities of breathing: Secondary | ICD-10-CM | POA: Diagnosis not present

## 2023-03-14 DIAGNOSIS — E66813 Obesity, class 3: Secondary | ICD-10-CM | POA: Diagnosis not present

## 2023-03-14 DIAGNOSIS — R0683 Snoring: Secondary | ICD-10-CM | POA: Diagnosis not present

## 2023-03-14 DIAGNOSIS — Z6841 Body Mass Index (BMI) 40.0 and over, adult: Secondary | ICD-10-CM

## 2023-03-14 NOTE — Assessment & Plan Note (Signed)
Snoring and gasping for breath witnessed Referral placed for sleep study with [facility]  Ordered and pending results.  Pt advised to work on diet and exercise as tolerated

## 2023-03-14 NOTE — Progress Notes (Signed)
Established Patient Office Visit  Subjective:   Patient ID: Rhonda Santiago, female    DOB: Nov 08, 1970  Age: 52 y.o. MRN: [id number]  CC:  Chief Complaint  Patient presents with   Possible Sleep Apnea    Pt wants to discuss possibly setting up a sleep study to screen for sleep apnea. States that she has been told that she snores and has had witnessed apneas.    HPI: Rhonda Santiago is a 52 y.o. female presenting on 03/14/2023 for Possible Sleep Apnea (Pt wants to discuss possibly setting up a sleep study to screen for sleep apnea. States that she has been told that she snores and has had witnessed apneas.)  About one year ago mom was sleeping nearby to her and mom had told her that she was making sounds while she was sleeping and some snoring. Kids also used to tell her she snores often. She does report fatigue a few times a week.   Obesity: she is aware that she is overweight, she tries to stay active and work on losing weight and has successfully lost about 50 pounds.  A few weeks ago she had the sensation of 'being aware of her heartbeat'. She was seen acutely in the office, EKG in office with NSR 02/23/23, she has not experienced these symptoms since.       ROS: Negative unless specifically indicated above in HPI.   Relevant past medical history reviewed and updated as indicated.   Allergies and medications reviewed and updated.   Current Outpatient Medications:    cetirizine (ZYRTEC) 10 MG tablet, Take 10 mg by mouth daily., Disp: , Rfl:    Multiple Vitamin (MULTI-VITAMINS) TABS, Take by mouth., Disp: , Rfl:   No Known Allergies  Objective:   BP 112/82 (BP Location: Right Arm, Patient Position: Sitting, Cuff Size: Large)   Pulse 94   Temp 97.9 F (36.6 C) (Oral)   Ht 5\' 10"  (1.778 m)   Wt 272 lb 6.4 oz (123.6 kg)   SpO2 97%   BMI 39.09 kg/m    Physical Exam Constitutional:      General: She is not in acute distress.    Appearance: Normal appearance. She  is normal weight. She is not ill-appearing, toxic-appearing or diaphoretic.  HENT:     Head: Normocephalic.  Cardiovascular:     Rate and Rhythm: Normal rate and regular rhythm.  Pulmonary:     Effort: Pulmonary effort is normal.     Breath sounds: Normal breath sounds.  Musculoskeletal:        General: Normal range of motion.     Right lower leg: No edema.     Left lower leg: No edema.  Neurological:     General: No focal deficit present.     Mental Status: She is alert and oriented to person, place, and time. Mental status is at baseline.  Psychiatric:        Mood and Affect: Mood normal.        Behavior: Behavior normal.        Thought Content: Thought content normal.        Judgment: Judgment normal.     Assessment & Plan:  Gasping for breath -     Ambulatory referral to Sleep Studies  Snoring -     Ambulatory referral to Sleep Studies  Class 3 severe obesity due to excess calories without serious comorbidity with body mass index (BMI) of 40.0 to 44.9 in adult [facility]) Assessment &  Plan: Snoring and gasping for breath witnessed Referral placed for sleep study with [facility]  Ordered and pending results.  Pt advised to work on diet and exercise as tolerated       Follow up plan: Return if symptoms worsen or fail to improve.  Mort Sawyers, FNP

## 2023-03-16 ENCOUNTER — Encounter: Payer: Self-pay | Admitting: *Deleted

## 2023-04-06 DIAGNOSIS — R7309 Other abnormal glucose: Secondary | ICD-10-CM | POA: Diagnosis not present

## 2023-04-18 DIAGNOSIS — Z1231 Encounter for screening mammogram for malignant neoplasm of breast: Secondary | ICD-10-CM | POA: Diagnosis not present

## 2023-04-18 DIAGNOSIS — Z1239 Encounter for other screening for malignant neoplasm of breast: Secondary | ICD-10-CM | POA: Diagnosis not present

## 2023-04-18 DIAGNOSIS — Z124 Encounter for screening for malignant neoplasm of cervix: Secondary | ICD-10-CM | POA: Diagnosis not present

## 2023-04-18 DIAGNOSIS — Z01411 Encounter for gynecological examination (general) (routine) with abnormal findings: Secondary | ICD-10-CM | POA: Diagnosis not present

## 2023-04-18 DIAGNOSIS — Z01419 Encounter for gynecological examination (general) (routine) without abnormal findings: Secondary | ICD-10-CM | POA: Diagnosis not present

## 2023-04-18 DIAGNOSIS — Z6839 Body mass index (BMI) 39.0-39.9, adult: Secondary | ICD-10-CM | POA: Diagnosis not present

## 2023-04-18 LAB — HM MAMMOGRAPHY

## 2023-04-19 ENCOUNTER — Ambulatory Visit: Payer: BC Managed Care – PPO | Admitting: Family

## 2023-04-19 ENCOUNTER — Encounter: Payer: Self-pay | Admitting: Family

## 2023-04-19 VITALS — BP 128/62 | HR 70 | Temp 97.3°F | Ht 70.0 in | Wt 270.0 lb

## 2023-04-19 DIAGNOSIS — E7800 Pure hypercholesterolemia, unspecified: Secondary | ICD-10-CM | POA: Diagnosis not present

## 2023-04-19 DIAGNOSIS — R0689 Other abnormalities of breathing: Secondary | ICD-10-CM

## 2023-04-19 DIAGNOSIS — Z23 Encounter for immunization: Secondary | ICD-10-CM

## 2023-04-19 DIAGNOSIS — R0683 Snoring: Secondary | ICD-10-CM | POA: Diagnosis not present

## 2023-04-19 DIAGNOSIS — Z1231 Encounter for screening mammogram for malignant neoplasm of breast: Secondary | ICD-10-CM | POA: Diagnosis not present

## 2023-04-19 DIAGNOSIS — Z6841 Body Mass Index (BMI) 40.0 and over, adult: Secondary | ICD-10-CM

## 2023-04-19 DIAGNOSIS — D508 Other iron deficiency anemias: Secondary | ICD-10-CM

## 2023-04-19 DIAGNOSIS — Z1211 Encounter for screening for malignant neoplasm of colon: Secondary | ICD-10-CM

## 2023-04-19 DIAGNOSIS — Z0000 Encounter for general adult medical examination without abnormal findings: Secondary | ICD-10-CM | POA: Insufficient documentation

## 2023-04-19 DIAGNOSIS — J302 Other seasonal allergic rhinitis: Secondary | ICD-10-CM

## 2023-04-19 DIAGNOSIS — Z0001 Encounter for general adult medical examination with abnormal findings: Secondary | ICD-10-CM

## 2023-04-19 DIAGNOSIS — E66813 Obesity, class 3: Secondary | ICD-10-CM

## 2023-04-19 LAB — LIPID PANEL
Cholesterol: 218 mg/dL — ABNORMAL HIGH (ref 0–200)
HDL: 59.9 mg/dL (ref >39.00)
LDL Cholesterol: 140 mg/dL — ABNORMAL HIGH (ref 0–99)
NonHDL: 158.45
Total CHOL/HDL Ratio: 4
Triglycerides: 94 mg/dL (ref 0.0–149.0)
VLDL: 18.8 mg/dL (ref 0.0–40.0)

## 2023-04-19 NOTE — Assessment & Plan Note (Signed)
Zyrtec prn  

## 2023-04-19 NOTE — Assessment & Plan Note (Signed)
Pt has not yet pursued sleep study  We will refer to pulmonary due to cost of [facility] ordering sleep study.

## 2023-04-19 NOTE — Assessment & Plan Note (Signed)
Ordered and pending results.  Pt advised to work on diet and exercise as tolerated

## 2023-04-19 NOTE — Patient Instructions (Addendum)
  [facility], call them to schedule your colonoscopy referral should still be good.  Address: [address], [place], [place] [address] Hours:  Open ? Closes 5?PM Phone: [phone]  ------------------------------------  A referral was placed today for pulmonary for sleep study.  Please let us know if you have not heard back within 2 weeks about the referral.  ------------------------------------  Recommendation for voltaren gel as needed for arthritis pain and or ibuprofen/tylenol over the counter. Also can use heat when needed.    Regards,   Mort Sawyers FNP-C

## 2023-04-19 NOTE — Assessment & Plan Note (Signed)
Ordered lipid panel, pending results. Work on low cholesterol diet and exercise as tolerated  

## 2023-04-19 NOTE — Assessment & Plan Note (Signed)

## 2023-04-19 NOTE — Progress Notes (Signed)
Subjective:  Patient ID: Rhonda Santiago, female    DOB: [birth year]  Age: 52 y.o. MRN: [id number]  Patient Care Team: Mort Sawyers, FNP as PCP - General (Family Medicine)   CC:  Chief Complaint  Patient presents with   Annual Exam    HPI Rhonda Santiago is a 52 y.o. female who presents today for an annual physical exam. She reports consuming a general diet. Gym/ health club routine includes light weights. She generally feels well. She reports sleeping well. She does not have additional problems to discuss today.   Vision:Within last year Dental:Receives regular dental care  Mammogram: 11/13 with her gynecology office  Last pap: 04/19/23 Colonoscopy: cologuard 03/12/2019, had wanted to do colonoscopy but was unable to schedule  TDAP: has been over ten years.  Shingles vaccine, open to this.   Pt is without acute concerns.   Advanced Directives Patient does not have advanced directives  DEPRESSION SCREENING    04/19/2023    9:53 AM 10/27/2022    8:22 AM 07/18/2022   10:43 AM 06/13/2022    8:26 AM 03/10/2020   12:24 PM 02/19/2019   11:14 AM 06/09/2013   10:01 AM  PHQ 2/9 Scores  PHQ - 2 Score 0 0 0 0 0 0 0  PHQ- 9 Score 0           ROS: Negative unless specifically indicated above in HPI.    Current Outpatient Medications:    cetirizine (ZYRTEC) 10 MG tablet, Take 10 mg by mouth daily., Disp: , Rfl:    Multiple Vitamin (MULTI-VITAMINS) TABS, Take by mouth., Disp: , Rfl:     Objective:    BP 128/62 (BP Location: Right Arm, Patient Position: Sitting, Cuff Size: Large)   Pulse 70   Temp (!) 97.3 F (36.3 C) (Temporal)   Ht 5\' 10"  (1.778 m)   Wt 270 lb (122.5 kg)   SpO2 98%   BMI 38.74 kg/m   BP Readings from Last 3 Encounters:  04/19/23 128/62  03/14/23 112/82  02/23/23 128/74      Physical Exam Constitutional:      General: She is not in acute distress.    Appearance: Normal appearance. She is obese. She is not ill-appearing.  HENT:     Head:  Normocephalic.     Right Ear: Tympanic membrane normal.     Left Ear: Tympanic membrane normal.     Nose: Nose normal.     Mouth/Throat:     Mouth: Mucous membranes are moist.  Eyes:     Extraocular Movements: Extraocular movements intact.     Pupils: Pupils are equal, round, and reactive to light.  Cardiovascular:     Rate and Rhythm: Normal rate and regular rhythm.  Pulmonary:     Effort: Pulmonary effort is normal.     Breath sounds: Normal breath sounds.  Abdominal:     General: Abdomen is flat. Bowel sounds are normal.     Palpations: Abdomen is soft.     Tenderness: There is no guarding or rebound.  Musculoskeletal:        General: Normal range of motion.     Cervical back: Normal range of motion.  Skin:    General: Skin is warm.     Capillary Refill: Capillary refill takes less than 2 seconds.  Neurological:     General: No focal deficit present.     Mental Status: She is alert.  Psychiatric:        Mood  and Affect: Mood normal.        Behavior: Behavior normal.        Thought Content: Thought content normal.        Judgment: Judgment normal.          Assessment & Plan:  Gasping for breath -     Ambulatory referral to Pulmonology  Snoring Assessment & Plan: Pt has not yet pursued sleep study  We will refer to pulmonary due to cost of [facility] ordering sleep study.  Orders: -     Ambulatory referral to Pulmonology  Screening mammogram for breast cancer  Screening for colon cancer  Encounter for general adult medical examination without abnormal findings Assessment & Plan: Patient Counseling(The following topics were reviewed):  Preventative care handout given to pt  Health maintenance and immunizations reviewed. Please refer to Health maintenance section. Pt advised on safe sex, wearing seatbelts in car, and proper nutrition labwork ordered today for annual Dental health: Discussed importance of regular tooth brushing, flossing, and dental  visits.    Iron deficiency anemia secondary to inadequate dietary iron intake Assessment & Plan: Cbc 9/24 stable.   Elevated LDL cholesterol level Assessment & Plan: Ordered lipid panel, pending results. Work on low cholesterol diet and exercise as tolerated   Orders: -     Lipid panel  Seasonal allergies Assessment & Plan: Zyrtec prn    Class 3 severe obesity due to excess calories without serious comorbidity with body mass index (BMI) of 40.0 to 44.9 in adult [facility]) Assessment & Plan: Ordered and pending results.  Pt advised to work on diet and exercise as tolerated        Follow-up: Return in about 1 year (around 04/18/2024) for f/u CPE.   Mort Sawyers, FNP

## 2023-04-19 NOTE — Assessment & Plan Note (Signed)
Cbc 9/24 stable.

## 2023-04-25 LAB — HM PAP SMEAR

## 2023-04-27 ENCOUNTER — Ambulatory Visit (INDEPENDENT_AMBULATORY_CARE_PROVIDER_SITE_OTHER): Payer: BC Managed Care – PPO

## 2023-04-27 DIAGNOSIS — Z23 Encounter for immunization: Secondary | ICD-10-CM | POA: Diagnosis not present

## 2023-05-02 ENCOUNTER — Encounter: Payer: Self-pay | Admitting: Primary Care

## 2023-05-02 ENCOUNTER — Encounter: Payer: Self-pay | Admitting: Internal Medicine

## 2023-05-06 DIAGNOSIS — R7309 Other abnormal glucose: Secondary | ICD-10-CM | POA: Diagnosis not present

## 2023-06-06 DIAGNOSIS — R7309 Other abnormal glucose: Secondary | ICD-10-CM | POA: Diagnosis not present

## 2023-07-07 DIAGNOSIS — R7309 Other abnormal glucose: Secondary | ICD-10-CM | POA: Diagnosis not present

## 2023-07-31 ENCOUNTER — Ambulatory Visit (INDEPENDENT_AMBULATORY_CARE_PROVIDER_SITE_OTHER): Payer: BC Managed Care – PPO

## 2023-07-31 DIAGNOSIS — Z23 Encounter for immunization: Secondary | ICD-10-CM | POA: Diagnosis not present

## 2023-07-31 NOTE — Progress Notes (Signed)
 Per orders of Mort Sawyers, NP, injection of Shingles  given by Melina Copa in left deltoid. Patient tolerated injection well.

## 2023-08-04 DIAGNOSIS — R7309 Other abnormal glucose: Secondary | ICD-10-CM | POA: Diagnosis not present

## 2023-08-08 ENCOUNTER — Encounter: Payer: Self-pay | Admitting: Family

## 2023-08-09 ENCOUNTER — Encounter: Payer: Self-pay | Admitting: Family

## 2023-08-09 NOTE — Progress Notes (Signed)
 noted

## 2023-09-03 DIAGNOSIS — M25552 Pain in left hip: Secondary | ICD-10-CM | POA: Diagnosis not present

## 2023-09-03 DIAGNOSIS — M9904 Segmental and somatic dysfunction of sacral region: Secondary | ICD-10-CM | POA: Diagnosis not present

## 2023-09-03 DIAGNOSIS — M9903 Segmental and somatic dysfunction of lumbar region: Secondary | ICD-10-CM | POA: Diagnosis not present

## 2023-09-03 DIAGNOSIS — M5451 Vertebrogenic low back pain: Secondary | ICD-10-CM | POA: Diagnosis not present

## 2023-09-04 DIAGNOSIS — R7309 Other abnormal glucose: Secondary | ICD-10-CM | POA: Diagnosis not present

## 2023-10-04 DIAGNOSIS — R7309 Other abnormal glucose: Secondary | ICD-10-CM | POA: Diagnosis not present

## 2023-10-23 DIAGNOSIS — M5451 Vertebrogenic low back pain: Secondary | ICD-10-CM | POA: Diagnosis not present

## 2023-10-23 DIAGNOSIS — M25552 Pain in left hip: Secondary | ICD-10-CM | POA: Diagnosis not present

## 2023-10-23 DIAGNOSIS — M9904 Segmental and somatic dysfunction of sacral region: Secondary | ICD-10-CM | POA: Diagnosis not present

## 2023-10-23 DIAGNOSIS — M9903 Segmental and somatic dysfunction of lumbar region: Secondary | ICD-10-CM | POA: Diagnosis not present

## 2023-10-25 DIAGNOSIS — M5451 Vertebrogenic low back pain: Secondary | ICD-10-CM | POA: Diagnosis not present

## 2023-10-25 DIAGNOSIS — M25552 Pain in left hip: Secondary | ICD-10-CM | POA: Diagnosis not present

## 2023-10-25 DIAGNOSIS — M9904 Segmental and somatic dysfunction of sacral region: Secondary | ICD-10-CM | POA: Diagnosis not present

## 2023-10-25 DIAGNOSIS — M9903 Segmental and somatic dysfunction of lumbar region: Secondary | ICD-10-CM | POA: Diagnosis not present

## 2023-11-04 DIAGNOSIS — R7309 Other abnormal glucose: Secondary | ICD-10-CM | POA: Diagnosis not present

## 2023-12-04 DIAGNOSIS — R7309 Other abnormal glucose: Secondary | ICD-10-CM | POA: Diagnosis not present

## 2023-12-31 ENCOUNTER — Encounter: Payer: Self-pay | Admitting: Family

## 2023-12-31 ENCOUNTER — Encounter: Payer: Self-pay | Admitting: Medical

## 2023-12-31 ENCOUNTER — Ambulatory Visit: Payer: Self-pay | Admitting: *Deleted

## 2023-12-31 ENCOUNTER — Ambulatory Visit (INDEPENDENT_AMBULATORY_CARE_PROVIDER_SITE_OTHER): Payer: Self-pay | Admitting: Medical

## 2023-12-31 VITALS — BP 138/85 | HR 81 | Temp 97.3°F

## 2023-12-31 DIAGNOSIS — R0689 Other abnormalities of breathing: Secondary | ICD-10-CM

## 2023-12-31 DIAGNOSIS — R030 Elevated blood-pressure reading, without diagnosis of hypertension: Secondary | ICD-10-CM

## 2023-12-31 DIAGNOSIS — R0683 Snoring: Secondary | ICD-10-CM

## 2023-12-31 DIAGNOSIS — R202 Paresthesia of skin: Secondary | ICD-10-CM

## 2023-12-31 NOTE — Telephone Encounter (Signed)
 Spoke with Rhonda Santiago and she states that she has been having wrist pain and tingling for the past week. Rhonda Santiago has a history of carpel tunnel. While speaking to the Rhonda Santiago she states that she is currently sitting at a [facility] having her BP checked. She states that she will try to have them address her wrist issue. Rhonda Santiago did want to make an appointment with Tabitha just in case. This has been scheduled for 01/02/24 at 0900.

## 2023-12-31 NOTE — Telephone Encounter (Signed)
 FYI Only or Action Required?: Action required by provider: request for appointment.  Patient was last seen in primary care on 04/19/2023 by Corwin Antu, FNP.  Called Nurse Triage reporting Numbness.  Symptoms began a week ago.  Interventions attempted: Rest, hydration, or home remedies.  Symptoms are: gradually worsening up to elbow .  Triage Disposition: See HCP Within 4 Hours (Or PCP Triage)  Patient/caregiver understands and will follow disposition?: Unsure             Copied from CRM #[id number]. Topic: Clinical - Red Word Triage >> Dec 31, 2023  1:50 PM Abigail D wrote: Red Word that prompted transfer to Nurse Triage: Numbness: Patient has been having some concerns with numbness in her right hand that started last week, she initially felt it in her thumb but now in her other fingers. She also woke up with a headache this morning. Reason for Disposition  [1] Numbness (i.e., loss of sensation) of the face, arm / hand, or leg / foot on one side of the body AND [2] gradual onset (e.g., days to weeks) AND [3] present now    Not present now .  Answer Assessment - Initial Assessment Questions No available appt today with in 4 hours due to numbness comes and goes up to elbow . Denies numbness now. Can left arm . No headache no blurred vision no dizziness now. No slurred speech. Recommended UC and if sx return go to ED or call 911. Patient requesting call back from PCP.       1. SYMPTOM: What is the main symptom you are concerned about? (e.g., weakness, numbness)     Numbness right thumb x 1 week ago  then hand  this am  arm to elbow denies numbness now  2. ONSET: When did this start? (e.g., minutes, hours, days; while sleeping)     1 week ago  3. LAST NORMAL: When was the last time you (the patient) were normal (no symptoms)?     1 week ago  4. PATTERN Does this come and go, or has it been constant since it started?  Is it present now?     Comes and goes , none  now  5. CARDIAC SYMPTOMS: Have you had any of the following symptoms: chest pain, difficulty breathing, palpitations?     no 6. NEUROLOGIC SYMPTOMS: Have you had any of the following symptoms: headache, dizziness, vision loss, double vision, changes in speech, unsteady on your feet?     Headache this am none now. Denies numbness now.  7. OTHER SYMPTOMS: Do you have any other symptoms?     Standing mild woozy feeling , lasting only  seconds. Unable to check BP  at this time. Recommended to check at work if possible. 8. PREGNANCY: Is there any chance you are pregnant? When was your last menstrual period?     na  Protocols used: Neurologic Deficit-A-AH

## 2023-12-31 NOTE — Progress Notes (Unsigned)
 [facility] [address] [place], [place] [address] Phone: [phone] Fax: [phone]   Office Visit Note  Patient Name: Rhonda Santiago  Date of Apmuy:[id number]  Med Rec number [id number]  Date of Service: 12/31/2023  Allergies: Patient has no known allergies.  No chief complaint on file.    HPI 53 y.o. female presents with   About a week ago while at beach, noted decreased sensation to distal right thumb. Noticed this off and on throughout week.  No other sx noted last week.  Noted similar feeling (tingly, slightly less sensation) to right hand (pinky, ring finger) today once she had arrived at work and was sitting at her computer working. Sensation does travel up her right arm at times. No longer has this feeling in her thumb. Slight headache when she woke up this morning, resolved with Ibuprofen. Occasionally gets HA when has not slept enough.   No chest pain or shortness of breath. No nausea or vomiting. No diaphoresis. No palpitations. No vision changes. No loss of strength.   Occasionally checks BP at home - at most 127/82  Hx of osteoarthritis in thumbs. No hx of carpal tunnel syndrome.   Current Medication:  Outpatient Encounter Medications as of 12/31/2023  Medication Sig   cetirizine (ZYRTEC) 10 MG tablet Take 10 mg by mouth daily.   Multiple Vitamin (MULTI-VITAMINS) TABS Take by mouth.   No facility-administered encounter medications on file as of 12/31/2023.      Medical History: Past Medical History:  Diagnosis Date   Abnormal Pap smear 11/15/2009   ASCUS / colpo 12/15/2009 low grade squamous , CIN1    Biliary colic    Obesity      Vital Signs: BP 138/85 (BP Location: Left Arm, Patient Position: Sitting, Cuff Size: Large)   Pulse 81   Temp (!) 97.3 F (36.3 C) (Oral)   SpO2 99%    Review of Systems  Physical Exam    Assessment/Plan:   General Counseling: Rhonda Santiago verbalizes understanding of the findings of todays visit and  agrees with plan of treatment. she has been encouraged to call the office with any questions or concerns that should arise related to todays visit.    Time spent:*** Minutes    Joen Arts PA-C [facility] 12/31/2023 2:51 PM

## 2024-01-02 ENCOUNTER — Ambulatory Visit: Admitting: Family

## 2024-01-02 ENCOUNTER — Encounter: Payer: Self-pay | Admitting: Family

## 2024-01-02 VITALS — BP 134/82 | HR 72 | Temp 97.8°F | Ht 70.0 in | Wt 278.6 lb

## 2024-01-02 DIAGNOSIS — R0683 Snoring: Secondary | ICD-10-CM

## 2024-01-02 DIAGNOSIS — G5601 Carpal tunnel syndrome, right upper limb: Secondary | ICD-10-CM | POA: Diagnosis not present

## 2024-01-02 NOTE — Assessment & Plan Note (Signed)
 Positive testing on physical exam Discussed wearing braces at night  Compression sleeve while typing prn  Ibuprofen tylenol  prn pain.  Arthritis on thumb, heat ice to site voltaren gel if needed.

## 2024-01-02 NOTE — Progress Notes (Signed)
 Established Patient Office Visit  Subjective:   Patient ID: Rhonda Santiago, female    DOB: [birth year]  Age: 53 y.o. MRN: [id number]  CC:  Chief Complaint  Patient presents with   Wrist Pain    Right. Onset last week. Began at her thumb but spread through her fingers.     HPI: Rhonda Santiago is a 53 y.o. female presenting on 01/02/2024 for Wrist Pain (Right. Onset last week. Began at her thumb but spread through her fingers. )  Last week while she was at the beach she noticed that she had a weird sensation in her right thumb. She also noticed there was some aching in the middle of the joint as well. She states once she got back to work after going to [employer] and while she was sitting she started to have a weird sensation in her right hand on her pink extending into her right wrist. The pain was achy and slightly tingling. The sensation comes and goes.   Obesity, had started a weight training class that she was going to a few times a week, but lately life has been stressful and finances and she stopped going to the gym. She worries often about her weight because she is worried about sleep apnea, I did refer her to pulmonary and she has an appointment tomorrow. She has maintained her 50 pound weight loss for the most part but she is wanting to lose more and has not really had time with life going on to stick to her diet and routine.   She does see a therapist regularly and this has been helpful.       ROS: Negative unless specifically indicated above in HPI.   Relevant past medical history reviewed and updated as indicated.   Allergies and medications reviewed and updated.   Current Outpatient Medications:    cetirizine (ZYRTEC) 10 MG tablet, Take 10 mg by mouth daily., Disp: , Rfl:    Multiple Vitamin (MULTI-VITAMINS) TABS, Take by mouth., Disp: , Rfl:   No Known Allergies  Objective:   BP 134/82   Pulse 72   Temp 97.8 F (36.6 C) (Temporal)   Ht 5' 10 (1.778 m)    Wt 278 lb 9.6 oz (126.4 kg)   SpO2 97%   BMI 39.97 kg/m    Physical Exam Vitals reviewed.  Constitutional:      General: She is not in acute distress.    Appearance: Normal appearance. She is normal weight. She is not ill-appearing, toxic-appearing or diaphoretic.  HENT:     Head: Normocephalic.  Cardiovascular:     Rate and Rhythm: Normal rate.  Pulmonary:     Effort: Pulmonary effort is normal.  Musculoskeletal:        General: Normal range of motion.     Right wrist: No bony tenderness or snuff box tenderness.     Left wrist: No bony tenderness or snuff box tenderness.     Comments: Positive allens and phalens   Neurological:     General: No focal deficit present.     Mental Status: She is alert and oriented to person, place, and time. Mental status is at baseline.  Psychiatric:        Mood and Affect: Mood normal.        Behavior: Behavior normal.        Thought Content: Thought content normal.        Judgment: Judgment normal.     Assessment &  Plan:  Carpal tunnel syndrome on right Assessment & Plan: Positive testing on physical exam Discussed wearing braces at night  Compression sleeve while typing prn  Ibuprofen tylenol  prn pain.  Arthritis on thumb, heat ice to site voltaren gel if needed.     Snoring Assessment & Plan: Pt pending sleep study/pulmonary eval with pulmonologist      Follow up plan: Return if symptoms worsen or fail to improve.  [name], FNP

## 2024-01-02 NOTE — Assessment & Plan Note (Signed)
 Pt pending sleep study/pulmonary eval with pulmonologist

## 2024-01-02 NOTE — Patient Instructions (Addendum)
-  Keep your appointment with your primary care provider later this week to further discuss/evaluate your symptoms. -Go to the emergency department if you develop any of the following: persistent or severe chest/arm/neck pain or headache, shortness of breath, worsening numbness, weakness in arm/leg, difficulty speaking, sudden vision change or facial droop.

## 2024-01-02 NOTE — Progress Notes (Incomplete)
 [facility] [address] [place], [place] [address]   Office Visit Note  Patient Name: Rhonda Santiago  Date of Apmuy:[id number]  Med Rec number [id number]  Date of Service: 12/31/2023  Allergies: Patient has no known allergies.  Chief Complaint  Patient presents with  . Numbness    Would like blood pressure checked     HPI 53 y.o. female presents with intermittent numbness (decreased sensation) to right hand/fingers.  About a week ago while on vacation at beach, noted decreased sensation to distal right thumb. Noticed this off and on throughout week.  No other sx noted at the time.  Noted similar feeling (tingly, slightly less sensation) to right pinky and ring fingers today after she had arrived at work and was sitting at her computer working. The sensation does travel up her right arm at times. No longer has this feeling in her thumb. Slight headache when she woke up this morning, resolved with Ibuprofen. Occasionally gets HA when has not slept enough, admits sleep less than ideal last few days.   Denies chest pain, shortness of breath, nausea or vomiting, diaphoresis, palpitations, vision changes or loss of strength.   Occasionally checks BP at home with automatic cuff - at most 127/82  Hx of osteoarthritis in thumbs. No hx of carpal tunnel syndrome. Denies trauma to head, neck or right UE.  Moving her right wrist around perhaps some helpful.   Current Medication:  Outpatient Encounter Medications as of 12/31/2023  Medication Sig  . cetirizine (ZYRTEC) 10 MG tablet Take 10 mg by mouth daily.  . Multiple Vitamin (MULTI-VITAMINS) TABS Take by mouth.   No facility-administered encounter medications on file as of 12/31/2023.      Medical History: Past Medical History:  Diagnosis Date  . Abnormal Pap smear 11/15/2009   ASCUS / colpo 12/15/2009 low grade squamous , CIN1   . Biliary colic   . Obesity      Vital Signs: BP 138/85 (BP Location: Left Arm,  Patient Position: Sitting, Cuff Size: Large)   Pulse 81   Temp (!) 97.3 F (36.3 C) (Oral)   SpO2 99%    Review of Systems  Physical Exam    Assessment/Plan:   General Counseling: Sapir verbalizes understanding of the findings of todays visit and agrees with plan of treatment. she has been encouraged to call the office with any questions or concerns that should arise related to todays visit.    Time spent:*** Minutes    Joen Arts PA-C [facility] 12/31/2023 2:51 PM

## 2024-01-03 ENCOUNTER — Encounter: Payer: Self-pay | Admitting: Sleep Medicine

## 2024-01-03 ENCOUNTER — Ambulatory Visit: Admitting: Sleep Medicine

## 2024-01-03 VITALS — BP 120/80 | HR 84 | Temp 98.3°F | Ht 70.0 in | Wt 278.4 lb

## 2024-01-03 DIAGNOSIS — G4733 Obstructive sleep apnea (adult) (pediatric): Secondary | ICD-10-CM | POA: Diagnosis not present

## 2024-01-03 DIAGNOSIS — E669 Obesity, unspecified: Secondary | ICD-10-CM | POA: Diagnosis not present

## 2024-01-03 NOTE — Patient Instructions (Signed)
 [name]

## 2024-01-03 NOTE — Progress Notes (Signed)
 Name:Liliyana MAKELLE MARRONE MRN: [id number] DOB: [birth year]   CHIEF COMPLAINT:  EXCESSIVE DAYTIME SLEEPINESS   HISTORY OF PRESENT ILLNESS:  Rhonda Santiago is a 54 y.o. w/ a h/o obesity who present for c/o excessive daytime sleepiness which has been present for several years. Reports nocturnal awakenings due to nocturia, however does not have difficulty falling back to sleep. Reports a 50 lb weight gain over the last few years. Admits to occasional dry mouth and morning headaches. Denies RLS symptoms, dream enactment, cataplexy, hypnagogic or hypnapompic hallucinations. Denies a family history of sleep apnea. Reports occasional drowsy driving. Drinks tea and soda occasionally, denies alcohol, tobacco or illicit drug use.   Bedtime 10-11 pm Sleep onset 5 mins Rise time 6-7 am   EPWORTH SLEEP SCORE 7     No data to display          PAST MEDICAL HISTORY :   has a past medical history of Abnormal Pap smear (11/15/2009), Allergy (Seasonal), Arthritis, Biliary colic, and Obesity.  has a past surgical history that includes Wisdom tooth extraction and Cholecystectomy (N/A, 07/06/2015). Prior to Admission medications   Medication Sig Start Date End Date Taking? Authorizing Provider  cetirizine (ZYRTEC) 10 MG tablet Take 10 mg by mouth daily.   Yes [provider]  Multiple Vitamin (MULTI-VITAMINS) TABS Take by mouth.   Yes [provider]   No Known Allergies  FAMILY HISTORY:  family history includes Atrial fibrillation in her mother; Brain cancer in her maternal aunt; Dementia in her paternal grandmother; Depression in her father and sister; Heart disease in her maternal grandfather; Lung cancer in her paternal grandfather; Stroke in her maternal grandmother. SOCIAL HISTORY:  reports that she has never smoked. She has never used smokeless tobacco. She reports that she does not currently use alcohol. She reports that she does not use drugs.   Review of  Systems:  Gen:  Denies  fever, sweats, chills weight loss  HEENT: Denies blurred vision, double vision, ear pain, eye pain, hearing loss, nose bleeds, sore throat Cardiac:  No dizziness, chest pain or heaviness, chest tightness,edema, No JVD Resp:   No cough, -sputum production, -shortness of breath,-wheezing, -hemoptysis,  Gi: Denies swallowing difficulty, stomach pain, nausea or vomiting, diarrhea, constipation, bowel incontinence Gu:  Denies bladder incontinence, burning urine Ext:   Denies Joint pain, stiffness or swelling Skin: Denies  skin rash, easy bruising or bleeding or hives Endoc:  Denies polyuria, polydipsia , polyphagia or weight change Psych:   Denies depression, insomnia or hallucinations  Other:  All other systems negative  VITAL SIGNS: BP 120/80 (BP Location: Right Arm, Patient Position: Sitting, Cuff Size: Large)   Pulse 84   Temp 98.3 F (36.8 C) (Oral)   Ht 5' 10 (1.778 m)   Wt 278 lb 6.4 oz (126.3 kg)   LMP  (LMP Unknown)   SpO2 97%   BMI 39.95 kg/m     Physical Examination:   General Appearance: No distress  EYES PERRLA, EOM intact.   NECK Supple, No JVD Pulmonary: normal breath sounds, No wheezing.  CardiovascularNormal S1,S2.  No m/r/g.   Abdomen: Benign, Soft, non-tender. Skin:   warm, no rashes, no ecchymosis  Extremities: normal, no cyanosis, clubbing. Neuro:without focal findings,  speech normal  PSYCHIATRIC: Mood, affect within normal limits.   ASSESSMENT AND PLAN  OSA I suspect that OSA is likely present due to clinical presentation. Discussed the consequences of untreated sleep apnea. Advised not to drive drowsy  for safety of patient and others. Will complete further evaluation with a home sleep study and follow up to review results.    Obesity Counseled patient on diet and lifestyle modification.    MEDICATION ADJUSTMENTS/LABS AND TESTS ORDERED: Recommend Sleep Study   Patient  satisfied with Plan of action and management. All  questions answered  Follow up to review HST results and treatment plan.   I spent a total of 30 minutes reviewing chart data, face-to-face evaluation with the patient, counseling and coordination of care as detailed above.    Real Cona, M.D.  [facility]

## 2024-01-04 DIAGNOSIS — R7309 Other abnormal glucose: Secondary | ICD-10-CM | POA: Diagnosis not present

## 2024-01-20 ENCOUNTER — Encounter

## 2024-01-20 DIAGNOSIS — G4730 Sleep apnea, unspecified: Secondary | ICD-10-CM | POA: Diagnosis not present

## 2024-01-20 DIAGNOSIS — G4733 Obstructive sleep apnea (adult) (pediatric): Secondary | ICD-10-CM

## 2024-01-31 DIAGNOSIS — R069 Unspecified abnormalities of breathing: Secondary | ICD-10-CM | POA: Diagnosis not present

## 2024-02-04 DIAGNOSIS — R7309 Other abnormal glucose: Secondary | ICD-10-CM | POA: Diagnosis not present

## 2024-02-05 ENCOUNTER — Ambulatory Visit: Payer: Self-pay

## 2024-02-05 DIAGNOSIS — G4733 Obstructive sleep apnea (adult) (pediatric): Secondary | ICD-10-CM

## 2024-02-06 NOTE — Telephone Encounter (Signed)
-----   Message from [facility] D REDDY sent at 02/05/2024  2:06 PM EDT ----- Please notify patient that HST revealed moderate OSA, recommend proceeding with APAP therapy set to 4-16 cm H2O, EPR 3 with the Airtouch N30i nasal mask. Please also schedule a 3 month follow up  visit if patient wishes to proceed with CPAP therapy. Thanks    ----- Message ----- From: Vannie Donzell RAMAN Sent: 02/01/2024   7:57 AM EDT To: Pallavi D Reddy, MD

## 2024-02-06 NOTE — Telephone Encounter (Signed)
 LMTCB. E2C2 please advise when patient calls back.

## 2024-02-13 NOTE — Telephone Encounter (Signed)
 Copied from CRM #[id number]. Topic: Clinical - Lab/Test Results >> Feb 12, 2024  5:00 PM Rozanna G wrote: Reason for CRM: Pt returned call, I went over the results with pt. And is wanting to start the CPAP therapy. Thanks

## 2024-02-13 NOTE — Telephone Encounter (Signed)
Order has been placed. Nothing further needed. 

## 2024-02-29 DIAGNOSIS — G4733 Obstructive sleep apnea (adult) (pediatric): Secondary | ICD-10-CM | POA: Diagnosis not present

## 2024-03-05 DIAGNOSIS — R7309 Other abnormal glucose: Secondary | ICD-10-CM | POA: Diagnosis not present

## 2024-03-10 ENCOUNTER — Encounter: Payer: Self-pay | Admitting: Medical

## 2024-03-10 ENCOUNTER — Ambulatory Visit: Payer: Self-pay | Admitting: Medical

## 2024-03-10 VITALS — BP 110/80 | HR 97 | Temp 98.1°F | Ht 70.0 in | Wt 278.0 lb

## 2024-03-10 DIAGNOSIS — T63421A Toxic effect of venom of ants, accidental (unintentional), initial encounter: Secondary | ICD-10-CM

## 2024-03-10 NOTE — Patient Instructions (Addendum)
-   Continue applying antibiotic ointment and Band-Aid to the wound on the top of your right foot and the inside of your right calf until healed. -You may take over-the-counter ibuprofen 400 to 600 mg every 6-8 hours or naproxen 220 to 440 mg every 12 hours as needed for pain or swelling. -Try to elevate and stay off of your right foot when possible until the swelling goes away. -You may continue applying over-the-counter 1% hydrocortisone  cream to any areas that are itchy until the itching resolves. -Continue Zyrtec once a day. -You may apply a cold compress (i.e. ice wrapped in a towel) for 10 to 15 minutes at a time to areas that are swollen or itchy. -Call, send MyChart message to provider or schedule return visit as needed for new/worsening symptoms (i.e. increased swelling, pain or redness, body rash or fever) or if symptoms are not improving over the next 3 to 5 days as discussed.

## 2024-03-10 NOTE — Progress Notes (Signed)
 [facility] [address] [place], [place] [address]   Office Visit Note  Patient Name: Rhonda Santiago Date of Birth [id number]  Medical Record number [id number]  Date of Service: 03/10/2024  Chief Complaint  Patient presents with   Insect Bite    Patient got bites on her R foot and leg while [place]. Her ankle is also swollen. Bites are itchy and the one on her leg has a white head on it. She has been applying hydrocortisone  cream.      HPI 53 y.o. female presents with insect bites to right lower extremity.  Was walking along side of road 2 days ago. Suspects she stepped into something, had small insects rapidly attack her right foot/ankle/leg. Bites were immediately painful. By that afternoon/evening, noted swelling and itching around bites. Has been applying OTC hydrocortisone  cream with some temporary relief. Noted a pustule and redness to lesion on right medial calf today. Another on dorsum of right foot has opened and is also more red than others.  Takes Zyrtec daily.   No fever or chills.  No generalized rash or itching.  Feels otherwise well.  Current Medication:  Outpatient Encounter Medications as of 03/10/2024  Medication Sig   cetirizine (ZYRTEC) 10 MG tablet Take 10 mg by mouth daily.   Multiple Vitamin (MULTI-VITAMINS) TABS Take by mouth.   No facility-administered encounter medications on file as of 03/10/2024.      Medical History: Past Medical History:  Diagnosis Date   Abnormal Pap smear 11/15/2009   ASCUS / colpo 12/15/2009 low grade squamous , CIN1    Allergy Seasonal   Arthritis    Left thumb joint   Biliary colic    Obesity      Vital Signs: BP 110/80   Pulse 97   Temp 98.1 F (36.7 C)   Ht 5' 10 (1.778 m)   Wt 278 lb (126.1 kg)   SpO2 97%   BMI 39.89 kg/m    Review of Systems  Constitutional:  Negative for chills, fatigue and fever.  Skin:  Positive for  wound (Right dorsal foot).       Insect bites with pruritus    Physical Exam Vitals reviewed.  Constitutional:      General: She is not in acute distress.    Appearance: She is not ill-appearing.  Musculoskeletal:     Right ankle: Swelling (Laterally) present. No deformity. No tenderness.       Legs:       Feet:     Comments: Mild swelling to posterior foot associated with bites.  Feet:     Comments: ~0.5 cm round shallow wound to dorsal right foot consistent with open blister. Skin:    Comments: Several small firm papules to lateral right ankle, overlying Achilles tendon and to dorsal right forefoot and digits.   Single 2 mm pustule with 0.5 cm halo of erythema to medial right calf.  Neurological:     Mental Status: She is alert.     Assessment/Plan: 1. Toxic effect of venom of ants, unintentional, initial encounter (Primary) Bites appear consistent with fire ants or another type of venomous ant.  Suspect swelling to foot/ankle is due to inflammation secondary to multiple ant bites.  After cleaning area with rubbing alcohol and with patient's permission, used a sterile hypodermic needle to open pustule on right medial calf.  No purulent drainage noted.  Suspect inflammation due to  ant bite/venom rather than infection.  Did apply triple antibiotic ointment to calf lesion and lesion on right dorsal foot, covered both with Band-Aid.  May continue to use over-the-counter 1% hydrocortisone  cream on areas that are itchy.  Also recommended over-the-counter NSAIDs as needed for pain/swelling.  Encouraged to elevate and stay off right foot when possible until swelling resolves.  Expect that itching will improve in the next few days.  Continue Zyrtec daily.  May apply a cold compress (i.e. ice wrapped in a towel) to itchy or swollen areas as needed for 10 to 15 minutes at a time.  Patient encouraged to call, send message to provider or schedule follow-up visit as needed for new/worsening symptoms  (i.e. increased swelling, pain or redness, body rash or fever) or if symptoms are not improving over the next 3 to 5 days as discussed.   Patient Instructions  - Continue applying antibiotic ointment and Band-Aid to the wound on the top of your right foot and the inside of your right calf until healed. -You may take over-the-counter ibuprofen 400 to 600 mg every 6-8 hours or naproxen 220 to 440 mg every 12 hours as needed for pain or swelling. -Try to elevate and stay off of your right foot when possible until the swelling goes away. -You may continue applying over-the-counter 1% hydrocortisone  cream to any areas that are itchy until the itching resolves. -Continue Zyrtec once a day. -You may apply a cold compress (i.e. ice wrapped in a towel) for 10 to 15 minutes at a time to areas that are swollen or itchy. -Call, send MyChart message to provider or schedule return visit as needed for new/worsening symptoms (i.e. increased swelling, pain or redness, body rash or fever) or if symptoms are not improving over the next 3 to 5 days as discussed.    General Counseling: [name] verbalizes understanding of the findings of todays visit and plan of treatment. she has been encouraged to call the office with any questions or concerns that should arise related to todays visit.    Time spent:20 Minutes    Joen Arts PA-C Physician Assistant

## 2024-03-13 ENCOUNTER — Ambulatory Visit: Payer: Self-pay

## 2024-03-13 DIAGNOSIS — Z23 Encounter for immunization: Secondary | ICD-10-CM

## 2024-03-26 ENCOUNTER — Ambulatory Visit: Payer: Self-pay | Admitting: Medical

## 2024-03-27 ENCOUNTER — Ambulatory Visit: Payer: Self-pay | Admitting: Adult Health

## 2024-03-28 ENCOUNTER — Other Ambulatory Visit: Payer: Self-pay

## 2024-03-28 ENCOUNTER — Encounter: Payer: Self-pay | Admitting: Physician Assistant

## 2024-03-28 ENCOUNTER — Ambulatory Visit: Payer: Self-pay | Admitting: Physician Assistant

## 2024-03-28 ENCOUNTER — Telehealth: Payer: Self-pay

## 2024-03-28 VITALS — BP 120/90 | HR 76 | Temp 96.9°F | Ht 70.0 in | Wt 278.0 lb

## 2024-03-28 DIAGNOSIS — R1032 Left lower quadrant pain: Secondary | ICD-10-CM

## 2024-03-28 DIAGNOSIS — K5901 Slow transit constipation: Secondary | ICD-10-CM

## 2024-03-28 DIAGNOSIS — R3129 Other microscopic hematuria: Secondary | ICD-10-CM

## 2024-03-28 LAB — POCT URINALYSIS DIPSTICK (MANUAL)
Leukocytes, UA: NEGATIVE
Nitrite, UA: NEGATIVE
Poct Bilirubin: NEGATIVE
Poct Glucose: NORMAL mg/dL
Poct Ketones: NEGATIVE
Poct Urobilinogen: NORMAL mg/dL
Spec Grav, UA: 1.025 (ref 1.010–1.025)
pH, UA: 6 (ref 5.0–8.0)

## 2024-03-28 NOTE — Progress Notes (Signed)
 Therapist, [facility] [address] [place], [place] [address]   Office Visit Note  Patient Name: Rhonda Santiago Date of Birth [id number]  Medical Record number [id number]  Date of Service: 03/28/2024  Chief Complaint  Patient presents with   Acute Visit    Patient has been having pain in her L groin area since last Saturday. She feels a strain and tightness in her lower back but states she was told the cartilage between her lower vertebrae is thin.     HPI Pt is here for an acute visit. States last Sat evening, she started having some LLQ pain. Has been constant. Feels tight, somewhat radiating to the back.  Thought it might be gas or maybe something is strained.  Of note, was told by a chiropractor that her discs are thin in her lower back, based on xrays. Has chronic back pain, but maybe a little worse now. Unsure if it's related to the LLQ pain she's having.   LLQ pain is better in the mornings when she first wakes up, then worsens during the day.  LBM 2 days ago, not hard/straining. That's not uncommon for her though. Doesn't have any change in bowel habits that she's noticed.  No bloody or dark tarry stools.  Some very slight urgency but no other urinary symptoms, malodorous urine.  No vaginal discharge/bleeding.  No n/v/d.  Hasn't taken anything for her symptoms.   Hx of cholecystectomy. Hasn't had a colonoscopy yet, had the mail in one a couple years ago, never made it in for her colonoscopy last year. No known hx of diverticulosis.    ROS: Review of Systems  Constitutional:  Negative for chills and fever.  Respiratory:  Negative for shortness of breath.   Cardiovascular:  Negative for chest pain.  Gastrointestinal:  Positive for abdominal pain. Negative for blood in stool, constipation, diarrhea, nausea and vomiting.  Genitourinary:  Positive for urgency. Negative for dysuria, hematuria, vaginal bleeding and vaginal discharge.  Musculoskeletal:  Positive for back  pain (chronic). Negative for arthralgias and myalgias.     Current Medication:  Outpatient Encounter Medications as of 03/28/2024  Medication Sig   cetirizine (ZYRTEC) 10 MG tablet Take 10 mg by mouth daily.   Multiple Vitamin (MULTI-VITAMINS) TABS Take by mouth.   No facility-administered encounter medications on file as of 03/28/2024.      Medical History: Past Medical History:  Diagnosis Date   Abnormal Pap smear 11/15/2009   ASCUS / colpo 12/15/2009 low grade squamous , CIN1    Allergy Seasonal   Arthritis    Left thumb joint   Biliary colic    Obesity      Vital Signs: BP (!) 120/90   Pulse 76   Temp (!) 96.9 F (36.1 C)   Ht 5' 10 (1.778 m)   Wt 126.1 kg   SpO2 98%   BMI 39.89 kg/m    Physical Exam Vitals and nursing note reviewed.  Constitutional:      General: She is not in acute distress.    Appearance: Normal appearance. She is well-developed. She is not toxic-appearing.     Comments: Afebrile, nontoxic, NAD  HENT:     Head: Normocephalic and atraumatic.     Mouth/Throat:     Mouth: Mucous membranes are moist.  Eyes:     General:        Right eye: No discharge.        Left eye: No discharge.  Conjunctiva/sclera: Conjunctivae normal.  Cardiovascular:     Rate and Rhythm: Normal rate and regular rhythm.     Pulses: Normal pulses.     Heart sounds: Normal heart sounds, S1 normal and S2 normal. No murmur heard.    No friction rub. No gallop.  Pulmonary:     Effort: Pulmonary effort is normal. No respiratory distress.     Breath sounds: Normal breath sounds. No decreased breath sounds, wheezing, rhonchi or rales.  Abdominal:     General: Bowel sounds are normal. There is no distension.     Palpations: Abdomen is soft. Abdomen is not rigid.     Tenderness: There is abdominal tenderness in the left lower quadrant. There is no right CVA tenderness, left CVA tenderness, guarding or rebound.     Comments: Soft, nondistended but obese, +BS  throughout, with mild LLQ TTP, no r/g/r, neg murphy's, no CVA TTP   Musculoskeletal:        General: Normal range of motion.     Cervical back: Normal range of motion and neck supple.     Comments: No lower back tenderness on exam.   Skin:    General: Skin is warm and dry.     Findings: No rash.  Neurological:     Mental Status: She is alert and oriented to person, place, and time.     Sensory: Sensation is intact. No sensory deficit.     Motor: Motor function is intact.  Psychiatric:        Mood and Affect: Mood and affect normal.        Behavior: Behavior normal.       Assessment/Plan: 1. LLQ pain (Primary) - POCT Urinalysis Dip Manual  2. Slow transit constipation  3. Microscopic hematuria - Urine Culture - POCT Urinalysis Dip Manual   -Pt here with LLQ pain x6 days, reports some slight urinary urgency, LBM 2days ago but that's normal for her. On exam, no lower back tenderness, mild LLQ TTP, nonperitoneal.  -U/A with neg nitrites/leuks, trace blood. Doubt UTI, will send for UCx and weekday team will f/up with it.  -Discussed DDx including constipation, diverticulosis/itis (though less likely -itis), muscle strain, kidney stone (though low likelihood). Doubt emergent etiology requiring further emergent w/up.  -Advised miralax daily to twice daily until BMs are soft and daily, titrate as needed to achieve this. Increase water and fiber intake.  -Tylenol /ibuprofen as needed for pain.  -Would f/up with her PCP in 1-2wks to make sure microscopic hematuria resolves, if it hasn't then may need further w/up or treatment.   -Strict ED/return precautions given.  -F/up as needed or if symptoms persisting.  -Also encouraged pt to call [facility] to reschedule her colonoscopy.     General Counseling: Marquis verbalizes understanding of the findings of todays visit and agrees with plan of treatment. I have discussed any further diagnostic evaluation that may be needed or ordered  today. We also reviewed her medications today. she has been encouraged to call the office with any questions or concerns that should arise related to todays visit.   Orders Placed This Encounter  Procedures   Urine Culture   POCT Urinalysis Dip Manual   Results for orders placed or performed in visit on 03/28/24 (from the past 24 hours)  POCT Urinalysis Dip Manual     Status: None   Collection Time: 03/28/24 11:48 AM  Result Value Ref Range   Spec Grav, UA 1.025 1.010 - 1.025   pH, UA  6.0 5.0 - 8.0   Leukocytes, UA Negative Negative   Nitrite, UA Negative Negative   Poct Protein trace Negative, trace mg/dL   Poct Glucose Normal Normal mg/dL   Poct Ketones Negative Negative   Poct Urobilinogen Normal Normal mg/dL   Poct Bilirubin Negative Negative   Poct Blood trace Negative, trace     No orders of the defined types were placed in this encounter.   Time spent: [address], Development worker, international aid

## 2024-03-28 NOTE — Telephone Encounter (Signed)
 Per pt phone call would like to schedule colonoscopy. Pt think she had one schedule and then rescheduled it but is unsure. I didn't see one scheduled.

## 2024-03-29 ENCOUNTER — Encounter: Payer: Self-pay | Admitting: Family

## 2024-03-30 DIAGNOSIS — G4733 Obstructive sleep apnea (adult) (pediatric): Secondary | ICD-10-CM | POA: Diagnosis not present

## 2024-03-30 LAB — URINE CULTURE

## 2024-04-01 ENCOUNTER — Ambulatory Visit: Admitting: Family

## 2024-04-01 VITALS — BP 120/64 | HR 81 | Temp 97.2°F | Ht 70.0 in | Wt 285.0 lb

## 2024-04-01 DIAGNOSIS — R1032 Left lower quadrant pain: Secondary | ICD-10-CM

## 2024-04-01 DIAGNOSIS — R311 Benign essential microscopic hematuria: Secondary | ICD-10-CM

## 2024-04-01 DIAGNOSIS — K921 Melena: Secondary | ICD-10-CM | POA: Diagnosis not present

## 2024-04-01 DIAGNOSIS — Z1211 Encounter for screening for malignant neoplasm of colon: Secondary | ICD-10-CM

## 2024-04-01 LAB — BASIC METABOLIC PANEL WITH GFR
BUN: 10 mg/dL (ref 6–23)
CO2: 26 meq/L (ref 19–32)
Calcium: 9.2 mg/dL (ref 8.4–10.5)
Chloride: 101 meq/L (ref 96–112)
Creatinine, Ser: 0.66 mg/dL (ref 0.40–1.20)
GFR: 98.9 mL/min (ref >60.00)
Glucose, Bld: 75 mg/dL (ref 70–99)
Potassium: 3.9 meq/L (ref 3.5–5.1)
Sodium: 138 meq/L (ref 135–145)

## 2024-04-01 LAB — CBC WITH DIFFERENTIAL/PLATELET
Basophils Absolute: 0 K/uL (ref 0.0–0.1)
Basophils Relative: 0.6 % (ref 0.0–3.0)
Eosinophils Absolute: 0 K/uL (ref 0.0–0.7)
Eosinophils Relative: 0.4 % (ref 0.0–5.0)
HCT: 38.9 % (ref 36.0–46.0)
Hemoglobin: 13 g/dL (ref 12.0–15.0)
Lymphocytes Relative: 25.7 % (ref 12.0–46.0)
Lymphs Abs: 1.6 K/uL (ref 0.7–4.0)
MCHC: 33.5 g/dL (ref 30.0–36.0)
MCV: 83.7 fl (ref 78.0–100.0)
Monocytes Absolute: 0.5 K/uL (ref 0.1–1.0)
Monocytes Relative: 8.1 % (ref 3.0–12.0)
Neutro Abs: 4.1 K/uL (ref 1.4–7.7)
Neutrophils Relative %: 65.2 % (ref 43.0–77.0)
Platelets: 300 K/uL (ref 150.0–400.0)
RBC: 4.65 Mil/uL (ref 3.87–5.11)
RDW: 14.3 % (ref 11.5–15.5)
WBC: 6.2 K/uL (ref 4.0–10.5)

## 2024-04-01 LAB — URINALYSIS, ROUTINE W REFLEX MICROSCOPIC
Bilirubin Urine: NEGATIVE
Hgb urine dipstick: NEGATIVE
Ketones, ur: 15 — AB
Leukocytes,Ua: NEGATIVE
Nitrite: NEGATIVE
RBC / HPF: NONE SEEN (ref 0–?)
Specific Gravity, Urine: <=1.005 — AB (ref 1.000–1.030)
Total Protein, Urine: NEGATIVE
Urine Glucose: NEGATIVE
Urobilinogen, UA: 0.2 (ref 0.0–1.0)
WBC, UA: NONE SEEN (ref 0–?)
pH: 6 (ref 5.0–8.0)

## 2024-04-01 NOTE — Progress Notes (Signed)
 Established Patient Office Visit  Subjective:      CC:  Chief Complaint  Patient presents with   Abdominal Pain    LLQ x 9 days. Was seen and urine tested showed blood in urine. Advised that she make app with PCP and get referral for GI     HPI: Rhonda Santiago is a 53 y.o. female presenting on 04/01/2024 for Abdominal Pain (LLQ x 9 days. Was seen and urine tested showed blood in urine. Advised that she make app with PCP and get referral for GI ) .  Discussed the use of AI scribe software for clinical note transcription with the patient, who gave verbal consent to proceed.  History of Present Illness Rhonda Santiago is a 53 year old female who presents with left lower abdominal pain and concerns about blood in her urine.  She has been experiencing left lower abdominal pain for ten days, initially thought to be gas-related. The pain is described as dull and achy, and while it has improved, it remains present. She has been on a liquid diet and using Miralax, which has provided some relief.  She has daily bowel movements, which have been loose and liquid due to her liquid diet. She has not observed blood in her stools but recalls two instances of a small streak of blood on toilet paper in the past few months. She does not have a gallbladder, which she believes affects her bowel movements.  She denies nausea, vomiting, acid reflux, or heartburn. She experienced urinary frequency and urgency last week, but these symptoms have resolved. A urine dip test on October 24th indicated blood, but no infection was found in the culture. She has not had a fever but felt chills suggesting a low-grade fever that did not persist.  She has a history of lower back strain and tightness, which was exacerbated by the recent abdominal pain but has since improved. She sees a land for chronic lower back issues.         Social history:  Relevant past medical, surgical, family and social  history reviewed and updated as indicated. Interim medical history since our last visit reviewed.  Allergies and medications reviewed and updated.  DATA REVIEWED: CHART IN EPIC     ROS: Negative unless specifically indicated above in HPI.    Current Outpatient Medications:    cetirizine (ZYRTEC) 10 MG tablet, Take 10 mg by mouth daily., Disp: , Rfl:    Multiple Vitamin (MULTI-VITAMINS) TABS, Take by mouth., Disp: , Rfl:    HYDROcodone -acetaminophen  (NORCO/VICODIN) 5-325 MG tablet, Take 1 tablet by mouth every 4 (four) hours as needed for severe pain (pain score 7-10)., Disp: 12 tablet, Rfl: 0   ondansetron  (ZOFRAN -ODT) 4 MG disintegrating tablet, 4mg  ODT q4 hours prn nausea/vomit, Disp: 10 tablet, Rfl: 0        Objective:        BP 120/64   Pulse 81   Temp (!) 97.2 F (36.2 C) (Temporal)   Ht 5' 10 (1.778 m)   Wt 285 lb (129.3 kg)   SpO2 97%   BMI 40.89 kg/m   Physical Exam ABDOMEN: Tenderness in the left lower abdomen. No tenderness in the upper abdomen.  Wt Readings from Last 3 Encounters:  04/07/24 285 lb 0.9 oz (129.3 kg)  04/01/24 285 lb (129.3 kg)  03/28/24 278 lb (126.1 kg)    Physical Exam Vitals reviewed.  Constitutional:      General: She is not in acute distress.  Appearance: Normal appearance. She is normal weight. She is not ill-appearing, toxic-appearing or diaphoretic.  HENT:     Head: Normocephalic.  Cardiovascular:     Rate and Rhythm: Normal rate.  Pulmonary:     Effort: Pulmonary effort is normal.  Abdominal:     General: Abdomen is flat. Bowel sounds are decreased. There is no distension.     Tenderness: There is abdominal tenderness in the left lower quadrant. There is no right CVA tenderness, left CVA tenderness, guarding or rebound.  Musculoskeletal:        General: Normal range of motion.  Neurological:     General: No focal deficit present.     Mental Status: She is alert and oriented to person, place, and time. Mental status  is at baseline.  Psychiatric:        Mood and Affect: Mood normal.        Behavior: Behavior normal.        Thought Content: Thought content normal.        Judgment: Judgment normal.          Results LABS Urine culture: No infection (03/28/2024)  DIAGNOSTIC Cologuard: Negative (2020)  Assessment & Plan:   Assessment and Plan Assessment & Plan Left lower abdominal pain with possible acute constipation exacerbation Left lower abdominal pain for 10 days, initially thought to be gas, now dull and achy. Pain improved with liquid diet and Miralax. Possible acute constipation exacerbation or mild diverticulitis. No significant change in bowel habits, occasional loose stools due to gallbladder removal. No blood in stool, but occasional streaks on toilet paper. Differential includes constipation, diverticulitis, and hemorrhoids. - Ordered fecal occult blood test to check for blood in stool - Ordered colonoscopy referral - Ordered lab tests to check white blood cell count, electrolytes, and kidney function  Microscopic hematuria, under evaluation Microscopic hematuria noted on dipstick test, no urinalysis performed. No current urinary symptoms. Previous urine culture negative for infection. Possible false positive due to unreliable dipstick test. - Obtained urine sample for urinalysis to confirm presence of blood  Low back pain, chronic with acute exacerbation Chronic low back pain with recent acute exacerbation. Pain improved but still present. No sharp or stabbing pain suggestive of kidney stones. Possible relation to recent abdominal pain. - Continue to monitor symptoms and manage conservatively        Return if symptoms worsen or fail to improve.     [name], MSN, APRN, FNP-C [facility]

## 2024-04-02 ENCOUNTER — Ambulatory Visit: Payer: Self-pay | Admitting: Family

## 2024-04-03 ENCOUNTER — Other Ambulatory Visit (INDEPENDENT_AMBULATORY_CARE_PROVIDER_SITE_OTHER): Payer: Self-pay | Admitting: Radiology

## 2024-04-03 ENCOUNTER — Ambulatory Visit: Payer: Self-pay

## 2024-04-03 ENCOUNTER — Encounter: Payer: Self-pay | Admitting: Family

## 2024-04-03 ENCOUNTER — Encounter: Payer: Self-pay | Admitting: Physician Assistant

## 2024-04-03 DIAGNOSIS — K921 Melena: Secondary | ICD-10-CM

## 2024-04-03 NOTE — Telephone Encounter (Signed)
 2nd attempt to contact patient--unable to leave VM due to VM box being full        Copied from CRM #[id number]. Topic: Clinical - Medication Question >> Apr 03, 2024 12:47 PM Viola F wrote: Patient hasn't had bowel movement since Monday, she seen [name] 04/01/24, wants to know if she should take Miralax? MyChart message with more information sent this morning from patient. Please call [phone] (M)

## 2024-04-03 NOTE — Telephone Encounter (Signed)
 FYI Only or Action Required?: Action required by provider: clinical question for provider and update on patient condition.  Patient was last seen in primary care on 04/01/2024 by Rhonda Antu, FNP.  Called Nurse Triage reporting Constipation.  Triage Disposition: Call PCP When Office is Open  Patient/caregiver understands and will follow disposition?: Yes      Copied from CRM [id number]. Topic: Clinical - Medication Question >> Apr 03, 2024 12:47 PM Viola F wrote: Patient hasn't had bowel movement since Monday, she seen [name] 04/01/24, wants to know if she should take Miralax? MyChart message with more information sent this morning from patient. Please call [phone] (M) Reason for Disposition  [1] Caller requesting NON-URGENT health information AND [2] PCP's office is the best resource  Answer Assessment - Initial Assessment Questions Patient states that she has finally had a bowel movement and she dropped off the stool sample to our office already today around 1-1:15pm. Patient states she had a soft bowel movement today and held together Patient had sent a MyChart to her provider detailed of what she has eaten recently. Patient states she would like any advice from her PCP on this situation and if she advises anything else further at this time as far as anything she needs to do as far as her bowel movements. She states she has been hydrating well with plenty of water   Patient is advised to call us  back if anything changes or with any further questions/concerns. Patient is advised that if anything worsens to go to the Emergency Room. Patient verbalized understanding.  Protocols used: Information Only Call - No Triage-A-AH

## 2024-04-03 NOTE — Telephone Encounter (Signed)
  1st attempt at calling patient--No answer---Unable to leave VM due to VM box full                Copied from CRM #[id number]. Topic: Clinical - Medication Question >> Apr 03, 2024 12:47 PM Viola F wrote: Patient hasn't had bowel movement since Monday, she seen [name] 04/01/24, wants to know if she should take Miralax? MyChart message with more information sent this morning from patient. Please call [phone] (M)

## 2024-04-04 LAB — FECAL OCCULT BLOOD, IMMUNOCHEMICAL: Fecal Occult Bld: NEGATIVE

## 2024-04-05 ENCOUNTER — Ambulatory Visit: Payer: Self-pay | Admitting: Adult Health

## 2024-04-05 DIAGNOSIS — R7309 Other abnormal glucose: Secondary | ICD-10-CM | POA: Diagnosis not present

## 2024-04-07 ENCOUNTER — Encounter (HOSPITAL_COMMUNITY): Payer: Self-pay | Admitting: *Deleted

## 2024-04-07 ENCOUNTER — Ambulatory Visit: Admitting: Family

## 2024-04-07 ENCOUNTER — Emergency Department (HOSPITAL_COMMUNITY)

## 2024-04-07 ENCOUNTER — Ambulatory Visit: Admitting: Sleep Medicine

## 2024-04-07 ENCOUNTER — Other Ambulatory Visit: Payer: Self-pay

## 2024-04-07 ENCOUNTER — Encounter: Payer: Self-pay | Admitting: Sleep Medicine

## 2024-04-07 ENCOUNTER — Emergency Department (HOSPITAL_COMMUNITY)
Admission: EM | Admit: 2024-04-07 | Discharge: 2024-04-07 | Disposition: A | Discharge status: Home / Self Care | Attending: Emergency Medicine | Admitting: Emergency Medicine

## 2024-04-07 VITALS — BP 114/70 | HR 66 | Temp 98.3°F | Ht 70.0 in | Wt 280.8 lb

## 2024-04-07 DIAGNOSIS — K659 Peritonitis, unspecified: Secondary | ICD-10-CM | POA: Diagnosis not present

## 2024-04-07 DIAGNOSIS — G4733 Obstructive sleep apnea (adult) (pediatric): Secondary | ICD-10-CM | POA: Diagnosis not present

## 2024-04-07 DIAGNOSIS — K5730 Diverticulosis of large intestine without perforation or abscess without bleeding: Secondary | ICD-10-CM | POA: Diagnosis not present

## 2024-04-07 DIAGNOSIS — K6389 Other specified diseases of intestine: Secondary | ICD-10-CM | POA: Diagnosis not present

## 2024-04-07 DIAGNOSIS — K388 Other specified diseases of appendix: Secondary | ICD-10-CM | POA: Diagnosis not present

## 2024-04-07 DIAGNOSIS — K449 Diaphragmatic hernia without obstruction or gangrene: Secondary | ICD-10-CM | POA: Diagnosis not present

## 2024-04-07 DIAGNOSIS — K429 Umbilical hernia without obstruction or gangrene: Secondary | ICD-10-CM | POA: Diagnosis not present

## 2024-04-07 DIAGNOSIS — Z6841 Body Mass Index (BMI) 40.0 and over, adult: Secondary | ICD-10-CM

## 2024-04-07 DIAGNOSIS — R1032 Left lower quadrant pain: Secondary | ICD-10-CM | POA: Diagnosis not present

## 2024-04-07 DIAGNOSIS — R109 Unspecified abdominal pain: Secondary | ICD-10-CM | POA: Diagnosis not present

## 2024-04-07 LAB — URINALYSIS, ROUTINE W REFLEX MICROSCOPIC
Bilirubin Urine: NEGATIVE
Glucose, UA: NEGATIVE mg/dL
Hgb urine dipstick: NEGATIVE
Ketones, ur: 20 mg/dL — AB
Nitrite: NEGATIVE
Protein, ur: 30 mg/dL — AB
Specific Gravity, Urine: 1.021 (ref 1.005–1.030)
pH: 5 (ref 5.0–8.0)

## 2024-04-07 LAB — COMPREHENSIVE METABOLIC PANEL WITH GFR
ALT: 16 U/L (ref 0–44)
AST: 21 U/L (ref 15–41)
Albumin: 3.9 g/dL (ref 3.5–5.0)
Alkaline Phosphatase: 80 U/L (ref 38–126)
Anion gap: 12 (ref 5–15)
BUN: 9 mg/dL (ref 6–20)
CO2: 22 mmol/L (ref 22–32)
Calcium: 9 mg/dL (ref 8.9–10.3)
Chloride: 103 mmol/L (ref 98–111)
Creatinine, Ser: 0.8 mg/dL (ref 0.44–1.00)
GFR, Estimated: >60 mL/min (ref >60)
Glucose, Bld: 104 mg/dL — ABNORMAL HIGH (ref 70–99)
Potassium: 3.6 mmol/L (ref 3.5–5.1)
Sodium: 137 mmol/L (ref 135–145)
Total Bilirubin: 0.9 mg/dL (ref 0.0–1.2)
Total Protein: 7.6 g/dL (ref 6.5–8.1)

## 2024-04-07 LAB — CBC
HCT: 40.3 % (ref 36.0–46.0)
Hemoglobin: 13.2 g/dL (ref 12.0–15.0)
MCH: 27.8 pg (ref 26.0–34.0)
MCHC: 32.8 g/dL (ref 30.0–36.0)
MCV: 85 fL (ref 80.0–100.0)
Platelets: 278 K/uL (ref 150–400)
RBC: 4.74 MIL/uL (ref 3.87–5.11)
RDW: 13.6 % (ref 11.5–15.5)
WBC: 6 K/uL (ref 4.0–10.5)
nRBC: 0 % (ref 0.0–0.2)

## 2024-04-07 LAB — LIPASE, BLOOD: Lipase: 36 U/L (ref 11–51)

## 2024-04-07 MED ORDER — IOHEXOL 350 MG/ML SOLN
80.0000 mL | Freq: Once | INTRAVENOUS | Status: AC | PRN
  Administered 2024-04-07: 80 mL via INTRAVENOUS

## 2024-04-07 MED ORDER — KETOROLAC TROMETHAMINE 30 MG/ML IJ SOLN
15.0000 mg | Freq: Once | INTRAMUSCULAR | Status: AC
  Administered 2024-04-07: 15 mg via INTRAVENOUS
  Filled 2024-04-07: qty 1

## 2024-04-07 MED ORDER — HYDROCODONE-ACETAMINOPHEN 5-325 MG PO TABS: 1.0000 | ORAL_TABLET | ORAL | 0 refills | Status: AC | PRN

## 2024-04-07 MED ORDER — ONDANSETRON 4 MG PO TBDP: ORAL_TABLET | ORAL | 0 refills | Status: AC

## 2024-04-07 NOTE — ED Triage Notes (Signed)
 Abd pain for 2 weeks intermittently minmal stools

## 2024-04-07 NOTE — Progress Notes (Signed)
       Name:Rhonda Santiago MRN: [id number] DOB: April 10, 1971   CHIEF COMPLAINT:  CPAP F/U   HISTORY OF PRESENT ILLNESS:  Rhonda Santiago is a 53 y.o. w/ a h/o OSA and morbid obesity who presents for CPAP F/U visit. Reports using CPAP therapy every night, which is confirmed by compliance data. She is currently using the Airfit N30i nasal mask, which is comfortable. Denies air leaks or nasal congestion. Reports feeling more refreshed upon awakening with CPAP therapy.   PAST MEDICAL HISTORY :   has a past medical history of Abnormal Pap smear (11/15/2009), Allergy (Seasonal), Arthritis, Biliary colic, and Obesity.  has a past surgical history that includes Wisdom tooth extraction and Cholecystectomy (N/A, 07/06/2015). Prior to Admission medications   Medication Sig Start Date End Date Taking? Authorizing Provider  cetirizine (ZYRTEC) 10 MG tablet Take 10 mg by mouth daily.   Yes [provider]  Multiple Vitamin (MULTI-VITAMINS) TABS Take by mouth.   Yes [provider]   No Known Allergies  FAMILY HISTORY:  family history includes Atrial fibrillation in her mother; Brain cancer in her maternal aunt; Dementia in her paternal grandmother; Depression in her father and sister; Heart disease in her maternal grandfather; Lung cancer in her paternal grandfather; Stroke in her maternal grandmother. SOCIAL HISTORY:  reports that she has never smoked. She has never used smokeless tobacco. She reports that she does not currently use alcohol. She reports that she does not use drugs.   Review of Systems:  Gen:  Denies  fever, sweats, chills weight loss  HEENT: Denies blurred vision, double vision, ear pain, eye pain, hearing loss, nose bleeds, sore throat Cardiac:  No dizziness, chest pain or heaviness, chest tightness,edema, No JVD Resp:   No cough, -sputum production, -shortness of breath,-wheezing, -hemoptysis,  Gi: Denies swallowing difficulty, stomach pain, nausea or  vomiting, diarrhea, constipation, bowel incontinence Gu:  Denies bladder incontinence, burning urine Ext:   Denies Joint pain, stiffness or swelling Skin: Denies  skin rash, easy bruising or bleeding or hives Endoc:  Denies polyuria, polydipsia , polyphagia or weight change Psych:   Denies depression, insomnia or hallucinations  Other:  All other systems negative  VITAL SIGNS: BP 114/70   Pulse 66   Temp 98.3 F (36.8 C)   Ht 5' 10 (1.778 m)   Wt 280 lb 12.8 oz (127.4 kg)   SpO2 98%   BMI 40.29 kg/m     Physical Examination:   General Appearance: No distress  EYES PERRLA, EOM intact.   NECK Supple, No JVD Pulmonary: normal breath sounds, No wheezing.  CardiovascularNormal S1,S2.  No m/r/g.   Abdomen: Benign, Soft, non-tender. Skin:   warm, no rashes, no ecchymosis  Extremities: normal, no cyanosis, clubbing. Neuro:without focal findings,  speech normal  PSYCHIATRIC: Mood, affect within normal limits.   ASSESSMENT AND PLAN  OSA Patient is using and benefiting from CPAP therapy. Discussed the consequences of untreated sleep apnea. Advised not to drive drowsy for safety of patient and others. Will follow up in 3 months.    Morbid obesity Counseled patient on diet and lifestyle modification.    Patient  satisfied with Plan of action and management. All questions answered  I spent a total of 23 minutes reviewing chart data, face-to-face evaluation with the patient, counseling and coordination of care as detailed above.    Rhonda Santiago, M.D.  Sleep Medicine [facility]

## 2024-04-07 NOTE — ED Provider Notes (Signed)
 [place] EMERGENCY DEPARTMENT AT [facility] Provider Note   CSN: [id number] Arrival date & time: 04/07/24  9891     Patient presents with: Abdominal Pain   Rhonda Santiago is a 53 y.o. female.   Patient presents to the emergency department for evaluation of abdominal pain.  Patient has been experiencing pain on the left side of the abdomen intermittently for 2 weeks.  She was seen in urgent care and told she might be constipated, has been using laxatives.  She reports that she has not been eating very much, stools have decreased.  They are soft but not diarrhea.  No rectal bleeding.  Pain got much worse tonight.       Prior to Admission medications   Medication Sig Start Date End Date Taking? Authorizing Provider  HYDROcodone -acetaminophen  (NORCO/VICODIN) 5-325 MG tablet Take 1 tablet by mouth every 4 (four) hours as needed for severe pain (pain score 7-10). 04/07/24  Yes Shalamar Plourde, Lonni PARAS, MD  ondansetron  (ZOFRAN -ODT) 4 MG disintegrating tablet 4mg  ODT q4 hours prn nausea/vomit 04/07/24  Yes Kevan Prouty, Lonni PARAS, MD  cetirizine (ZYRTEC) 10 MG tablet Take 10 mg by mouth daily.    [provider]  Multiple Vitamin (MULTI-VITAMINS) TABS Take by mouth.    [provider]    Allergies: Patient has no known allergies.    Review of Systems  Updated Vital Signs BP (!) 145/99   Pulse 88   Temp 97.6 F (36.4 C) (Oral)   Resp 16   Ht 5' 10 (1.778 m)   Wt 129.3 kg   SpO2 100%   BMI 40.90 kg/m   Physical Exam Vitals and nursing note reviewed.  Constitutional:      General: She is not in acute distress.    Appearance: She is well-developed.  HENT:     Head: Normocephalic and atraumatic.     Mouth/Throat:     Mouth: Mucous membranes are moist.  Eyes:     General: Vision grossly intact. Gaze aligned appropriately.     Extraocular Movements: Extraocular movements intact.     Conjunctiva/sclera: Conjunctivae normal.  Cardiovascular:      Rate and Rhythm: Normal rate and regular rhythm.     Pulses: Normal pulses.     Heart sounds: Normal heart sounds, S1 normal and S2 normal. No murmur heard.    No friction rub. No gallop.  Pulmonary:     Effort: Pulmonary effort is normal. No respiratory distress.     Breath sounds: Normal breath sounds.  Abdominal:     General: Bowel sounds are normal.     Palpations: Abdomen is soft.     Tenderness: There is abdominal tenderness. There is no guarding or rebound.     Hernia: No hernia is present.  Musculoskeletal:        General: No swelling.     Cervical back: Full passive range of motion without pain, normal range of motion and neck supple. No spinous process tenderness or muscular tenderness. Normal range of motion.     Right lower leg: No edema.     Left lower leg: No edema.  Skin:    General: Skin is warm and dry.     Capillary Refill: Capillary refill takes less than 2 seconds.     Findings: No ecchymosis, erythema, rash or wound.  Neurological:     General: No focal deficit present.     Mental Status: She is alert and oriented to person, place, and time.  GCS: GCS eye subscore is 4. GCS verbal subscore is 5. GCS motor subscore is 6.     Cranial Nerves: Cranial nerves 2-12 are intact.     Sensory: Sensation is intact.     Motor: Motor function is intact.     Coordination: Coordination is intact.  Psychiatric:        Attention and Perception: Attention normal.        Mood and Affect: Mood normal.        Speech: Speech normal.        Behavior: Behavior normal.     (all labs ordered are listed, but only abnormal results are displayed) Labs Reviewed  COMPREHENSIVE METABOLIC PANEL WITH GFR - Abnormal; Notable for the following components:      Result Value   Glucose, Bld 104 (*)    All other components within normal limits  URINALYSIS, ROUTINE W REFLEX MICROSCOPIC - Abnormal; Notable for the following components:   APPearance HAZY (*)    Ketones, ur 20 (*)     Protein, ur 30 (*)    Leukocytes,Ua MODERATE (*)    Bacteria, UA RARE (*)    All other components within normal limits  LIPASE, BLOOD  CBC    EKG: None  Radiology: CT ABDOMEN PELVIS W CONTRAST Result Date: 04/07/2024 EXAM: CT ABDOMEN AND PELVIS WITH CONTRAST 04/07/2024 04:02:00 AM TECHNIQUE: CT of the abdomen and pelvis was performed with the administration of 80 mL of iohexol  (OMNIPAQUE ) 350 MG/ML intravenous contrast. Multiplanar reformatted images are provided for review. Automated exposure control, iterative reconstruction, and/or weight-based adjustment of the mA/kV was utilized to reduce the radiation dose to as low as reasonably achievable. COMPARISON: None available. CLINICAL HISTORY: LLQ abdominal pain. FINDINGS: LOWER CHEST: No acute abnormality. LIVER: The liver is mildly steatotic and measures 19 cm in length. There is no mass enhancement. GALLBLADDER AND BILE DUCTS: The gallbladder is absent. No biliary ductal dilatation. SPLEEN: No acute abnormality. PANCREAS: No acute abnormality. ADRENAL GLANDS: No acute abnormality. There is no adrenal mass. KIDNEYS, URETERS AND BLADDER: No stones in the kidneys or ureters. No hydronephrosis. No perinephric or periureteral stranding. Urinary bladder is unremarkable. No renal mass enhancement. GI AND BOWEL: Small hiatal hernia. Unremarkable contracted stomach. Normal caliber of unopacified small bowel and subcecal appendix. Scattered colonic diverticula are present without evidence of acute diverticulitis. Slightly lateral to the junction of the descending and sigmoid colon there is an ovoid fatty structure measuring 2.4 x 1.3 cm on series 4, axial 54 with a thin hyperenhancing rim and a mild adjacent inflammatory reaction. The findings are consistent with epiploic appendagitis. No other inflammatory changes are seen throughout. There is no bowel obstruction. PERITONEUM AND RETROPERITONEUM: No ascites. No free air. There is a small umbilical fat hernia.  No incarcerated hernia. VASCULATURE: Aorta is normal in caliber. There is a circumaortic left renal vein. LYMPH NODES: No lymphadenopathy. REPRODUCTIVE ORGANS: No acute abnormality. BONES AND SOFT TISSUES: No acute osseous abnormality. No focal soft tissue abnormality. IMPRESSION: 1. Findings consistent with epiploic appendagitis, adjacent to the junction of the descending and sigmoid colon. 2. Diverticulosis of the colon without evidence of acute diverticulitis . Electronically signed by: Francis Quam MD 04/07/2024 04:52 AM EST RP Workstation: [id number]     Procedures   Medications Ordered in the ED  ketorolac (TORADOL) 30 MG/ML injection 15 mg (has no administration in time range)  iohexol  (OMNIPAQUE ) 350 MG/ML injection 80 mL (80 mLs Intravenous Contrast Given 04/07/24 0411)  Medical Decision Making Amount and/or Complexity of Data Reviewed Labs: ordered. Decision-making details documented in ED Course. Radiology: ordered and independent interpretation performed. Decision-making details documented in ED Course.  Risk Prescription drug management.   Differential Diagnosis considered includes, but not limited to: Appendicitis; colitis; diverticulitis; bowel obstruction; cystitis; nephrolithiasis; pyelonephritis; ovarian cyst, ovarian torsion, PID, ectopic pregnancy.   Patient with intermittent abdominal pain for 2 weeks, much worse tonight.  Lab work unrevealing.  Abdominal exam is fairly benign, no significant tenderness, no guarding or rebound.  CT scan performed to further evaluate.  Patient does have diverticulosis without diverticulitis.  She does have acute epiploic appendagitis in the descending colon which explains left lower quadrant pain.  Supportive care provided.     Final diagnoses:  Epiploic appendagitis    ED Discharge Orders          Ordered    HYDROcodone -acetaminophen  (NORCO/VICODIN) 5-325 MG tablet  Every 4 hours PRN         04/07/24 0614    ondansetron  (ZOFRAN -ODT) 4 MG disintegrating tablet        04/07/24 9385               Haze Lonni PARAS, MD 04/07/24 [phone]

## 2024-04-07 NOTE — ED Notes (Signed)
 Patient transported to CT

## 2024-04-07 NOTE — Patient Instructions (Signed)

## 2024-04-09 ENCOUNTER — Encounter: Payer: Self-pay | Admitting: Family

## 2024-04-11 ENCOUNTER — Encounter: Payer: Self-pay | Admitting: Pediatrics

## 2024-04-11 ENCOUNTER — Ambulatory Visit (INDEPENDENT_AMBULATORY_CARE_PROVIDER_SITE_OTHER)
Admission: RE | Admit: 2024-04-11 | Discharge: 2024-04-11 | Disposition: A | Discharge status: Home / Self Care | Source: Ambulatory Visit | Attending: Physician Assistant | Admitting: Physician Assistant

## 2024-04-11 ENCOUNTER — Ambulatory Visit: Admitting: Physician Assistant

## 2024-04-11 ENCOUNTER — Encounter: Payer: Self-pay | Admitting: Physician Assistant

## 2024-04-11 VITALS — BP 126/78 | HR 74 | Ht 70.0 in | Wt 282.0 lb

## 2024-04-11 DIAGNOSIS — K760 Fatty (change of) liver, not elsewhere classified: Secondary | ICD-10-CM

## 2024-04-11 DIAGNOSIS — R1032 Left lower quadrant pain: Secondary | ICD-10-CM

## 2024-04-11 DIAGNOSIS — Z6841 Body Mass Index (BMI) 40.0 and over, adult: Secondary | ICD-10-CM

## 2024-04-11 DIAGNOSIS — Z1211 Encounter for screening for malignant neoplasm of colon: Secondary | ICD-10-CM

## 2024-04-11 DIAGNOSIS — E66813 Obesity, class 3: Secondary | ICD-10-CM

## 2024-04-11 DIAGNOSIS — K5730 Diverticulosis of large intestine without perforation or abscess without bleeding: Secondary | ICD-10-CM

## 2024-04-11 DIAGNOSIS — R195 Other fecal abnormalities: Secondary | ICD-10-CM | POA: Diagnosis not present

## 2024-04-11 MED ORDER — NA SULFATE-K SULFATE-MG SULF 17.5-3.13-1.6 GM/177ML PO SOLN: 1.0000 | Freq: Once | ORAL | 0 refills | Status: AC

## 2024-04-11 NOTE — Patient Instructions (Addendum)
 Your provider has requested that you have an abdominal x ray before leaving today. Please go to the basement floor to our Radiology department for the test.  Diverticulosis Diverticulosis is a condition that develops when small pouches (diverticula) form in the wall of the large intestine (colon). The colon is where water is absorbed and stool (feces) is formed. The pouches form when the inside layer of the colon pushes through weak spots in the outer layers of the colon. You may have a few pouches or many of them. The pouches usually do not cause problems unless they become inflamed or infected. When this happens, the condition is called diverticulitis- this is left lower quadrant pain, diarrhea, fever, chills, nausea or vomiting.  If this occurs please call the office or go to the hospital. Sometimes these patches without inflammation can also have painless bleeding associated with them, if this happens please call the office or go to the hospital. Preventing constipation and increasing fiber can help reduce diverticula and prevent complications. Even if you feel you have a high-fiber diet, suggest getting on Benefiber or Cirtracel 2 times daily.  Toileting tips to help with your constipation - Drink at least 64-80 ounces of water/liquid per day. - Establish a time to try to move your bowels every day.  For many people, this is after a cup of coffee or after a meal such as breakfast. - Sit all of the way back on the toilet keeping your back fairly straight and while sitting up, try to rest the tops of your forearms on your upper thighs.   - Raising your feet with a step stool/squatty potty can be helpful to improve the angle that allows your stool to pass through the rectum. - Relax the rectum feeling it bulge toward the toilet water.  If you feel your rectum raising toward your body, you are contracting rather than relaxing. - Breathe in and slowly exhale. Belly breath by expanding your belly  towards your belly button. Keep belly expanded as you gently direct pressure down and back to the anus.  A low pitched GRRR sound can assist with increasing intra-abdominal pressure.  (Can also trying to blow on a pinwheel and make it move, this helps with the same belly breathing) - Repeat 3-4 times. If unsuccessful, contract the pelvic floor to restore normal tone and get off the toilet.  Avoid excessive straining. - To reduce excessive wiping by teaching your anus to normally contract, place hands on outer aspect of knees and resist knee movement outward.  Hold 5-10 second then place hands just inside of knees and resist inward movement of knees.  Hold 5 seconds.  Repeat a few times each way.  Go to the ER if unable to pass gas, severe AB pain, unable to hold down food, any shortness of breath of chest pain.   Here some information about pelvic floor dysfunction. This may be contributing to some of your symptoms. We will continue with our evaluation but I do want you to consider adding on fiber supplement with low-dose MiraLAX daily. We could also refer to pelvic floor physical therapy.   Pelvic Floor Dysfunction, Female Pelvic floor dysfunction (PFD) is a condition that results when the group of muscles and connective tissues that support the organs in the pelvis (pelvic floor muscles) do not work well. These muscles and their connections form a sling that supports the colon and bladder. In women, they also support the uterus. PFD causes pelvic floor muscles to  be too weak, too tight, or both. In PFD, muscle movements are not coordinated. This may cause bowel or bladder problems. It may also cause pain. What are the causes? This condition may be caused by an injury to the pelvic area or by a weakening of pelvic muscles. This often results from pregnancy and childbirth or other types of strain. In many cases, the exact cause is not known. What increases the risk? The following factors may make  you more likely to develop this condition: Having chronic bladder tissue inflammation (interstitial cystitis). Being an older person. Being overweight. History of radiation treatment for cancer in the pelvic region. Previous pelvic surgery, such as removal of the uterus (hysterectomy). What are the signs or symptoms? Symptoms of this condition vary and may include: Bladder symptoms, such as: Trouble starting urination and emptying the bladder. Frequent urinary tract infections. Leaking urine when coughing, laughing, or exercising (stress incontinence). Having to pass urine urgently or frequently. Pain when passing urine. Bowel symptoms, such as: Constipation. Urgent or frequent bowel movements. Incomplete bowel movements. Painful bowel movements. Leaking stool or gas. Unexplained genital or rectal pain. Genital or rectal muscle spasms. Low back pain. Other symptoms may include: A heavy, full, or aching feeling in the vagina. A bulge that protrudes into the vagina. Pain during or after sex. How is this diagnosed? This condition may be diagnosed based on: Your symptoms and medical history. A physical exam. During the exam, your health care provider may check your pelvic muscles for tightness, spasm, pain, or weakness. This may include a rectal exam and a pelvic exam. In some cases, you may have diagnostic tests, such as: Electrical muscle function tests. Urine flow testing. X-ray tests of bowel function. Ultrasound of the pelvic organs. How is this treated? Treatment for this condition depends on the symptoms. Treatment options include: Physical therapy. This may include Kegel exercises to help relax or strengthen the pelvic floor muscles. Biofeedback. This type of therapy provides feedback on how tight your pelvic floor muscles are so that you can learn to control them. Internal or external massage therapy. A treatment that involves electrical stimulation of the pelvic floor  muscles to help control pain (transcutaneous electrical nerve stimulation, or TENS). Sound wave therapy (ultrasound) to reduce muscle spasms. Medicines, such as: Muscle relaxants. Bladder control medicines. Surgery to reconstruct or support pelvic floor muscles may be an option if other treatments do not help. Follow these instructions at home: Activity Do your usual activities as told by your health care provider. Ask your health care provider if you should modify any activities. Do pelvic floor strengthening or relaxing exercises at home as told by your physical therapist. Lifestyle Maintain a healthy weight. Eat foods that are high in fiber, such as beans, whole grains, and fresh fruits and vegetables. Limit foods that are high in fat and processed sugars, such as fried or sweet foods. Manage stress with relaxation techniques such as yoga or meditation. General instructions If you have problems with leakage: Use absorbable pads or wear padded underwear. Wash frequently with mild soap. Keep your genital and anal area as clean and dry as possible. Ask your health care provider if you should try a barrier cream to prevent skin irritation. Take warm baths to relieve pelvic muscle tension or spasms. Take over-the-counter and prescription medicines only as told by your health care provider. Keep all follow-up visits. How is this prevented? The cause of PFD is not always known, but there are a few things  you can do to reduce the risk of developing this condition, including: Staying at a healthy weight. Getting regular exercise. Managing stress. Contact a health care provider if: Your symptoms are not improving with home care. You have signs or symptoms of PFD that get worse at home. You develop new signs or symptoms. You have signs of a urinary tract infection, such as: Fever. Chills. Increased urinary frequency. A burning feeling when urinating. You have not had a bowel movement in  3 days (constipation). Summary Pelvic floor dysfunction results when the muscles and connective tissues in your pelvic floor do not work well. These muscles and their connections form a sling that supports your colon and bladder. In women, they also support the uterus. PFD may be caused by an injury to the pelvic area or by a weakening of pelvic muscles. PFD causes pelvic floor muscles to be too weak, too tight, or a combination of both. Symptoms may vary from person to person. In most cases, PFD can be treated with physical therapies and medicines. Surgery may be an option if other treatments do not help. This information is not intended to replace advice given to you by your health care provider. Make sure you discuss any questions you have with your health care provider. Document Revised: 09/29/2020 Document Reviewed: 09/29/2020 Elsevier Patient Education  2022 [employer]   Metabolic dysfunction associated seatohepatitis  Now the leading cause of liver failure in the [place] .  It is normally from such risk factors as obesity, diabetes, insulin resistance, high cholesterol, or metabolic syndrome.  The only definitive therapy is weight loss and exercise.   Suggest walking 20-30 mins daily.  Decreasing carbohydrates, increasing veggies.    Fatty Liver Fatty liver is the accumulation of fat in liver cells. It is also called hepatosteatosis or steatohepatitis. It is normal for your liver to contain some fat. If fat is more than 5 to 10% of your liver's weight, you have fatty liver.  There are often no symptoms (problems) for years while damage is still occurring. People often learn about their fatty liver when they have medical tests for other reasons. Fat can damage your liver for years or even decades without causing problems. When it becomes severe, it can cause fatigue, weight loss, weakness, and confusion. This makes you more likely to develop more serious liver problems. The  liver is the largest organ in the body. It does a lot of work and often gives no warning signs when it is sick until late in a disease. The liver has many important jobs including: Breaking down foods. Storing vitamins, iron, and other minerals. Making proteins. Making bile for food digestion. Breaking down many products including medications, alcohol and some poisons.  PROGNOSIS  Fatty liver may cause no damage or it can lead to an inflammation of the liver. This is, called steatohepatitis.  Over time the liver may become scarred and hardened. This condition is called cirrhosis. Cirrhosis is serious and may lead to liver failure or cancer. NASH is one of the leading causes of cirrhosis. About 10-20% of Americans have fatty liver and a smaller 2-5% has NASH.  TREATMENT  Weight loss, fat restriction, and exercise in overweight patients produces inconsistent results but is worth trying. Good control of diabetes may reduce fatty liver. Eat a balanced, healthy diet. Increase your physical activity. There are no medical or surgical treatments for a fatty liver or NASH, but improving your diet and increasing your exercise may help prevent or  reverse some of the damage.   Silent reflux: Not all heartburn burns...SABRASABRASABRA  What is LPR? Laryngopharyngeal reflux (LPR) or silent reflux is a condition in which acid that is made in the stomach travels up the esophagus (swallowing tube) and gets to the throat. Not everyone with reflux has a lot of heartburn or indigestion. In fact, many people with LPR never have heartburn. This is why LPR is called SILENT REFLUX, and the terms Silent reflux and LPR are often used interchangeably. Because LPR is silent, it is sometimes difficult to diagnose.  How can you tell if you have LPR?  Chronic hoarseness- Some people have hoarseness that comes and goes throat clearing  Cough It can cause shortness of breath and cause asthma like symptoms. a feeling of a lump  in the throat  difficulty swallowing a problem with too much nose and throat drainage.  Some people will feel their esophagus spasm which feels like their heart beating hard and fast, this will usually be after a meal, at rest, or lying down at night.    How do I treat this? Treatment for LPR should be individualized, and your doctor will suggest the best treatment for you. Generally there are several treatments for LPR: changing habits and diet to reduce reflux,  medications to reduce stomach acid, and  surgery to prevent reflux. Most people with LPR need to modify how and when they eat, as well as take some medication, to get well. Sometimes, nonprescription liquid antacids, such as Maalox, Gelucil and Mylanta are recommended. When used, these antacids should be taken four times each day - one tablespoon one hour after each meal and before bedtime. Dietary and lifestyle changes alone are not often enough to control LPR - medications that reduce stomach acid are also usually needed. These must be prescribed by our doctor.   TIPS FOR REDUCING REFLUX AND LPR Control your LIFE-STYLE and your DIET! If you use tobacco, QUIT.  Smoking makes you reflux. After every cigarette you have some LPR.  Don't wear clothing that is too tight, especially around the waist (trousers, corsets, belts).  Do not lie down just after eating...in fact, do not eat within three hours of bedtime.  You should be on a low-fat diet.  Limit your intake of red meat.  Limit your intake of butter.  Avoid fried foods.  Avoid chocolate  Avoid cheese.  Avoid eggs. Specifically avoid caffeine (especially coffee and tea), soda pop (especially cola) and mints.  Avoid alcoholic beverages, particularly in the evening.    You have been scheduled for a colonoscopy. Please follow written instructions given to you at your visit today.   If you use inhalers (even only as needed), please bring them with you on the day of your  procedure.  DO NOT TAKE 7 DAYS PRIOR TO TEST- Trulicity (dulaglutide) Ozempic, Wegovy (semaglutide) Mounjaro, Zepbound (tirzepatide) Bydureon Bcise (exanatide extended release)  DO NOT TAKE 1 DAY PRIOR TO YOUR TEST Rybelsus (semaglutide) Adlyxin (lixisenatide) Victoza (liraglutide) Byetta (exanatide) ___________________________________________________________________________

## 2024-04-11 NOTE — Progress Notes (Signed)
 04/11/2024 Clotilda JAYSON Acron [id number] 03-Aug-1970  Referring provider: Corwin Antu, FNP Primary GI doctor: Dr. Suzann  ASSESSMENT AND PLAN:  Need for screening colonoscopy 04/03/2024 FOBT negative No family history of colon cancer/ IBD Will plan colonoscopy at [facility], We have discussed the risks of bleeding, infection, perforation, medication reactions, and remote risk of death associated with colonoscopy. All questions were answered and the patient acknowledges these risk and wishes to proceed.  Left lower quadrant abdominal pain/pressure x 10/08, CT showing epiplotic appendagitis Status post cholecystectomy 04/07/2024 CTAP W epiplotic appendagitis adjacent to the junction of descending and sigmoid colon, small HH, diverticulosis without diverticulitis Likely epiplotic appendagitis, improving, no pain on exam Possible IBS/pelvic floor dysfunction, given information Improving symptoms  Morbid obesity with OSA Body mass index is 40.46 kg/m.  -Patient has been advised to make an attempt to improve diet and exercise patterns to aid in weight loss. -Recommended diet heavy in fruits and veggies and low in animal meats, cheeses, and dairy products, appropriate calorie intake  Hepatic Steatosis seen on imaging  without LFT elevation S/p cholecystectomy     Latest Ref Rng & Units 04/07/2024    1:31 AM 02/23/2023   10:58 AM 01/12/2022   10:37 AM  Hepatic Function  Total Protein 6.5 - 8.1 g/dL 7.6  7.5  7.0   Albumin 3.5 - 5.0 g/dL 3.9  4.1  3.8   AST 15 - 41 U/L 21  19  19    ALT 0 - 44 U/L 16  14  21    Alk Phosphatase 38 - 126 U/L 80  96  87   Total Bilirubin 0.0 - 1.2 mg/dL 0.9  0.5  0.3    Platelets 278  - need LFTs and CBC monitored every 6 months, - evaluation with imaging every 2-3 years.  - Encouraged diet/exercise for modest 10% body weight loss as treatment for hepatic steatosis -Continue to work on risk factor modification including diet exercise and control of risk  factors including blood sugars.  Diverticulosis Will call if any symptoms. Add on fiber supplement, avoid NSAIDS, information given  GERD rare dysphagia, has hiatal hernia on CT -Lifestyle changes discussed, avoid NSAIDS, ETOH, hand out given to the patient -Weight loss discussed with the patient  I have reviewed the clinic note as outlined by Alan Coombs, PA and agree with the assessment, plan and medical decision making.  Ms. Guinyard presents to the office today reporting recent left lower quadrant abdominal pain ultimately diagnosed with epiploic appendagitis on CT imaging.  Also with history of diverticulosis but no diverticulitis.  She has never had screening colonoscopy.  No family history of colorectal cancer or polyps.  Agree with scheduling screening colonoscopy.  Imaging has disclosed evidence of hepatic steatosis without liver enzyme abnormalities.  Will continue to monitor LFTs on a regular basis.  Inocente Suzann, MD    Patient Care Team: Corwin Antu, FNP as PCP - General (Family Medicine)  HISTORY OF PRESENT ILLNESS: 53 y.o. female with a past medical history listed below presents for evaluation of LLQ AB pain and screening colonoscopy.   Discussed the use of AI scribe software for clinical note transcription with the patient, who gave verbal consent to proceed.  History of Present Illness   SHARNE LINDERS is a 53 year old female who presents with left lower quadrant abdominal pain and pressure.  She began experiencing left lower quadrant abdominal pain on October 18th, following her mother's 80th birthday party. Initially, the  pain was significant but not debilitating, and she thought it might be related to gas or a pulled muscle. Over the following days, the pain transitioned to a sensation of pressure, which fluctuated in intensity.  During the week following the onset of symptoms, she was preoccupied with her pregnant daughter, who was experiencing early labor,  and was caring for her grandchild. She visited a wellness center where a PA suggested constipation might be the cause and recommended Miralax and a high-fiber diet. She began taking Miralax but noted increasing pressure over the next week.  By the end of the second week, the pressure had intensified, particularly in the evenings. She visited her primary care physician, who noted a small amount of blood in her urine, which was not visible.  On November 3rd, after a particularly strenuous weekend, she went to the ER due to significant pressure and was diagnosed with epiploic appendagitis. She received an anti-inflammatory in the ER but did not fill the prescription for hydrocodone . She was advised to take ibuprofen 600 mg every eight hours for four to six days.  Her bowel movements have been soft and occurring daily since stopping Miralax, with no diarrhea or constipation. She notes increased urinary frequency, which she attributes to increased water intake. She has a history of hemorrhoids and has noticed occasional blood on toilet paper, but recent tests showed no blood in her stool or urine.      She  reports that she has never smoked. She has never used smokeless tobacco. She reports that she does not currently use alcohol. She reports that she does not use drugs.  RELEVANT GI HISTORY, IMAGING AND LABS: Results   LABS Urinalysis: No blood Stool sample: No blood (04/03/2024)  RADIOLOGY CT scan abdomen: No lymphadenopathy, no colonic mass, no hepatic mass, epiploic appendagitis, small hiatal hernia, hepatic steatosis (04/07/2024)      CBC    Component Value Date/Time   WBC 6.0 04/07/2024 0131   RBC 4.74 04/07/2024 0131   HGB 13.2 04/07/2024 0131   HGB 13.2 03/12/2020 0848   HCT 40.3 04/07/2024 0131   HCT 40.4 03/12/2020 0848   PLT 278 04/07/2024 0131   PLT 287 03/12/2020 0848   MCV 85.0 04/07/2024 0131   MCV 86 03/12/2020 0848   MCH 27.8 04/07/2024 0131   MCHC 32.8 04/07/2024 0131    RDW 13.6 04/07/2024 0131   RDW 13.3 03/12/2020 0848   LYMPHSABS 1.6 04/01/2024 1301   LYMPHSABS 1.5 03/12/2020 0848   MONOABS 0.5 04/01/2024 1301   EOSABS 0.0 04/01/2024 1301   EOSABS 0.1 03/12/2020 0848   BASOSABS 0.0 04/01/2024 1301   BASOSABS 0.1 03/12/2020 0848   Recent Labs    04/01/24 1301 04/07/24 0131  HGB 13.0 13.2    CMP     Component Value Date/Time   NA 137 04/07/2024 0131   NA 139 03/12/2020 0848   K 3.6 04/07/2024 0131   CL 103 04/07/2024 0131   CO2 22 04/07/2024 0131   GLUCOSE 104 (H) 04/07/2024 0131   BUN 9 04/07/2024 0131   BUN 11 03/12/2020 0848   CREATININE 0.80 04/07/2024 0131   CALCIUM 9.0 04/07/2024 0131   PROT 7.6 04/07/2024 0131   PROT 6.7 03/12/2020 0848   ALBUMIN 3.9 04/07/2024 0131   ALBUMIN 4.2 03/12/2020 0848   AST 21 04/07/2024 0131   ALT 16 04/07/2024 0131   ALKPHOS 80 04/07/2024 0131   BILITOT 0.9 04/07/2024 0131   BILITOT 0.3 03/12/2020 0848  GFRNONAA >60 04/07/2024 0131   GFRAA 97 03/12/2020 0848      Latest Ref Rng & Units 04/07/2024    1:31 AM 02/23/2023   10:58 AM 01/12/2022   10:37 AM  Hepatic Function  Total Protein 6.5 - 8.1 g/dL 7.6  7.5  7.0   Albumin 3.5 - 5.0 g/dL 3.9  4.1  3.8   AST 15 - 41 U/L 21  19  19    ALT 0 - 44 U/L 16  14  21    Alk Phosphatase 38 - 126 U/L 80  96  87   Total Bilirubin 0.0 - 1.2 mg/dL 0.9  0.5  0.3       Current Medications:     Current Outpatient Medications (Respiratory):    cetirizine (ZYRTEC) 10 MG tablet, Take 10 mg by mouth daily.  Current Outpatient Medications (Analgesics):    ibuprofen (ADVIL) 100 MG tablet, Take 100 mg by mouth every 6 (six) hours as needed for fever.   HYDROcodone -acetaminophen  (NORCO/VICODIN) 5-325 MG tablet, Take 1 tablet by mouth every 4 (four) hours as needed for severe pain (pain score 7-10). (Patient not taking: Reported on 04/11/2024)   Current Outpatient Medications (Other):    Multiple Vitamin (MULTI-VITAMINS) TABS, Take by mouth.   Na  Sulfate-K Sulfate-Mg Sulfate concentrate (SUPREP) 17.5-3.13-1.6 GM/177ML SOLN, Take 1 kit (354 mLs total) by mouth once for 1 dose.   ondansetron  (ZOFRAN -ODT) 4 MG disintegrating tablet, 4mg  ODT q4 hours prn nausea/vomit (Patient not taking: Reported on 04/11/2024)  Medical History:  Past Medical History:  Diagnosis Date   Abnormal Pap smear 11/15/2009   ASCUS / colpo 12/15/2009 low grade squamous , CIN1    Allergy Seasonal   Arthritis    Left thumb joint   Biliary colic    Obesity    Allergies: No Known Allergies   Surgical History:  She  has a past surgical history that includes Wisdom tooth extraction and Cholecystectomy (N/A, 07/06/2015). Family History:  Her family history includes Atrial fibrillation in her mother; Brain cancer in her maternal aunt; Dementia in her paternal grandmother; Depression in her father and sister; Heart disease in her maternal grandfather; Lung cancer in her paternal grandfather; Stroke in her maternal grandmother.  REVIEW OF SYSTEMS  : All other systems reviewed and negative except where noted in the History of Present Illness.  PHYSICAL EXAM: BP 126/78   Pulse 74   Ht 5' 10 (1.778 m)   Wt 282 lb (127.9 kg)   BMI 40.46 kg/m  Physical Exam   VITALS: BP- 145/99 GENERAL APPEARANCE: Well nourished, in no apparent distress. HEENT: No cervical lymphadenopathy, unremarkable thyroid , sclerae anicteric, conjunctiva pink. RESPIRATORY: Respiratory effort normal, breath sounds equal bilaterally without rales, rhonchi, or wheezing. CARDIO: Regular rate and rhythm with no murmurs, rubs, or gallops, peripheral pulses intact. ABDOMEN: Soft, non-distended, active bowel sounds in all four quadrants, slight tenderness to palpation, no rebound, no mass appreciated. RECTAL: Declines. MUSCULOSKELETAL: Full range of motion, normal gait, without edema. SKIN: Dry, intact without rashes or lesions. No jaundice. NEURO: Alert, oriented, no focal deficits. PSYCH:  Cooperative, normal mood and affect.      Alan JONELLE Coombs, PA-C 3:12 PM

## 2024-04-14 NOTE — Progress Notes (Unsigned)
 [facility] History and Physical   Primary Care Physician:  Corwin Antu, FNP   Reason for Procedure:  Colorectal cancer screening  Plan:    Colonoscopy   The patient was provided an opportunity to ask questions and all were answered. The patient agreed with the plan.   HPI: Rhonda Santiago is a 53 y.o. female undergoing colonoscopy for colorectal cancer screening.  This is the patient's first colonoscopy.  Cologuard negative in 2020.  No family history of colorectal cancer or polyps.  Patient denies current symptoms of rectal bleeding or change in bowel habits.   Past Medical History:  Diagnosis Date   Abnormal Pap smear 11/15/2009   ASCUS / colpo 12/15/2009 low grade squamous , CIN1    Allergy Seasonal   Arthritis    Left thumb joint   Biliary colic    Obesity     Past Surgical History:  Procedure Laterality Date   CHOLECYSTECTOMY N/A 07/06/2015   Procedure: LAPAROSCOPIC CHOLECYSTECTOMY WITH INTRAOPERATIVE CHOLANGIOGRAM;  Surgeon: Krystal Spinner, MD;  Location: [facility] OR;  Service: General;  Laterality: N/A;   WISDOM TOOTH EXTRACTION      Prior to Admission medications   Medication Sig Start Date End Date Taking? Authorizing Provider  cetirizine (ZYRTEC) 10 MG tablet Take 10 mg by mouth daily.    [provider]  HYDROcodone -acetaminophen  (NORCO/VICODIN) 5-325 MG tablet Take 1 tablet by mouth every 4 (four) hours as needed for severe pain (pain score 7-10). Patient not taking: Reported on 04/11/2024 04/07/24   Haze Lonni PARAS, MD  ibuprofen (ADVIL) 100 MG tablet Take 100 mg by mouth every 6 (six) hours as needed for fever.    [provider]  Multiple Vitamin (MULTI-VITAMINS) TABS Take by mouth.    [provider]  ondansetron  (ZOFRAN -ODT) 4 MG disintegrating tablet 4mg  ODT q4 hours prn nausea/vomit Patient not taking: Reported on 04/11/2024 04/07/24   Haze Lonni PARAS, MD    Current Outpatient Medications  Medication Sig  Dispense Refill   cetirizine (ZYRTEC) 10 MG tablet Take 10 mg by mouth daily.     HYDROcodone -acetaminophen  (NORCO/VICODIN) 5-325 MG tablet Take 1 tablet by mouth every 4 (four) hours as needed for severe pain (pain score 7-10). (Patient not taking: Reported on 04/11/2024) 12 tablet 0   ibuprofen (ADVIL) 100 MG tablet Take 100 mg by mouth every 6 (six) hours as needed for fever.     Multiple Vitamin (MULTI-VITAMINS) TABS Take by mouth.     ondansetron  (ZOFRAN -ODT) 4 MG disintegrating tablet 4mg  ODT q4 hours prn nausea/vomit (Patient not taking: Reported on 04/11/2024) 10 tablet 0   No current facility-administered medications for this visit.    Allergies as of 04/16/2024   (No Known Allergies)    Family History  Problem Relation Age of Onset   Atrial fibrillation Mother    Depression Father        suicide   Depression Sister        suicide   Stroke Maternal Grandmother    Heart disease Maternal Grandfather    Dementia Paternal Grandmother    Lung cancer Paternal Grandfather        lung   Brain cancer Maternal Aunt     Social History   Socioeconomic History   Marital status: Widowed    Spouse name: Not on file   Number of children: 2   Years of education: Not on file   Highest education level: Not on file  Occupational History   Not on  file  Tobacco Use   Smoking status: Never   Smokeless tobacco: Never  Vaping Use   Vaping status: Never Used  Substance and Sexual Activity   Alcohol use: Not Currently    Comment: rarely   Drug use: No   Sexual activity: Not Currently    Partners: Male  Other Topics Concern   Not on file  Social History Narrative   Husband committed suicide in 2008 she was 53 y/o Works at [employer].      Son 20    Daughter 22    Social Drivers of Corporate Investment Banker Strain: Not on file  Food Insecurity: Not on file  Transportation Needs: Not on file  Physical Activity: Not on file  Stress: Not on file  Social Connections: Not on file   Intimate Partner Violence: Not on file    Review of Systems:  All other review of systems negative except as mentioned in the HPI.  Physical Exam: Vital signs There were no vitals taken for this visit.  General:   Alert,  Well-developed, well-nourished, pleasant and cooperative in NAD Airway:  Mallampati  Lungs:  Clear throughout to auscultation.   Heart:  Regular rate and rhythm; no murmurs, clicks, rubs,  or gallops. Abdomen:  Soft, nontender and nondistended. Normal bowel sounds.   Neuro/Psych:  Normal mood and affect. A and O x 3  Inocente Hausen, MD [facility]

## 2024-04-16 ENCOUNTER — Encounter: Payer: Self-pay | Admitting: Pediatrics

## 2024-04-16 ENCOUNTER — Ambulatory Visit: Payer: Self-pay | Admitting: Physician Assistant

## 2024-04-16 ENCOUNTER — Ambulatory Visit (AMBULATORY_SURGERY_CENTER): Admitting: Pediatrics

## 2024-04-16 VITALS — BP 113/76 | HR 65 | Temp 98.1°F | Resp 13 | Ht 70.0 in | Wt 282.0 lb

## 2024-04-16 DIAGNOSIS — K625 Hemorrhage of anus and rectum: Secondary | ICD-10-CM

## 2024-04-16 DIAGNOSIS — K648 Other hemorrhoids: Secondary | ICD-10-CM | POA: Diagnosis not present

## 2024-04-16 DIAGNOSIS — K5730 Diverticulosis of large intestine without perforation or abscess without bleeding: Secondary | ICD-10-CM | POA: Diagnosis not present

## 2024-04-16 DIAGNOSIS — Z1211 Encounter for screening for malignant neoplasm of colon: Secondary | ICD-10-CM | POA: Diagnosis not present

## 2024-04-16 MED ORDER — SODIUM CHLORIDE 0.9 % IV SOLN: 500.0000 mL | Freq: Once | INTRAVENOUS | Status: DC

## 2024-04-16 NOTE — Op Note (Signed)
 [facility] Patient Name: Rhonda Santiago Procedure Date: 04/16/2024 11:19 AM MRN: [id number] Endoscopist: Inocente Hausen , MD, [id number] Age: 53 Referring MD:  Date of Birth: 08-12-1970 Gender: Female Account #: [id number] Procedure:                Colonoscopy Indications:              Screening for colorectal malignant neoplasm, This                            is the patient's first colonoscopy, Incidental                            abdominal pain noted -recent diagnosis of epiploic                            appendagitis, reports of scant rectal bleeding Medicines:                Monitored Anesthesia Care Procedure:                Pre-Anesthesia Assessment:                           - Prior to the procedure, a History and Physical                            was performed, and patient medications and                            allergies were reviewed. The patient's tolerance of                            previous anesthesia was also reviewed. The risks                            and benefits of the procedure and the sedation                            options and risks were discussed with the patient.                            All questions were answered, and informed consent                            was obtained. Prior Anticoagulants: The patient has                            taken no anticoagulant or antiplatelet agents. ASA                            Grade Assessment: III - A patient with severe                            systemic disease. After reviewing the risks and  benefits, the patient was deemed in satisfactory                            condition to undergo the procedure.                           After obtaining informed consent, the colonoscope                            was passed under direct vision. Throughout the                            procedure, the patient's blood pressure, pulse, and                            oxygen  saturations were monitored continuously. The                            Olympus Scope SN: [id number] was introduced through                            the anus and advanced to the cecum, identified by                            appendiceal orifice and ileocecal valve. The                            colonoscopy was performed without difficulty. The                            patient tolerated the procedure well. The quality                            of the bowel preparation was good. The ileocecal                            valve, appendiceal orifice, and rectum were                            photographed. Scope In: 11:38:26 AM Scope Out: 11:51:20 AM Scope Withdrawal Time: 0 hours 8 minutes 58 seconds  Total Procedure Duration: 0 hours 12 minutes 54 seconds  Findings:                 The perianal and digital rectal examinations were                            normal. Pertinent negatives include normal                            sphincter tone and no palpable rectal lesions.                           A few small-mouthed diverticula were found in the  sigmoid colon and descending colon.                           The colon (entire examined portion) appeared normal.                           Internal hemorrhoids were found during retroflexion. Complications:            No immediate complications. Estimated blood loss:                            None. Estimated Blood Loss:     Estimated blood loss: none. Impression:               - Diverticulosis in the sigmoid colon and in the                            descending colon.                           - The entire examined colon is normal.                           - Internal hemorrhoids. This is a likely source of                            rectal bleeding.                           - No specimens collected. Recommendation:           - Discharge patient to home (ambulatory).                           - Repeat colonoscopy  in 10 years for screening                            purposes.                           - The findings and recommendations were discussed                            with the patient's family.                           - Return to referring physician.                           - Patient has a contact number available for                            emergencies. The signs and symptoms of potential                            delayed complications were discussed with the  patient. Return to normal activities tomorrow.                            Written discharge instructions were provided to the                            patient. Inocente Hausen, MD 04/16/2024 11:55:27 AM This report has been signed electronically.

## 2024-04-16 NOTE — Progress Notes (Signed)
 Sedate, gd SR, tolerated procedure well, VSS, report to RN

## 2024-04-16 NOTE — Patient Instructions (Signed)
 Discharge instructions given. Handouts on Diverticulosis and Hemorrhoids. Resume previous medications. YOU HAD AN ENDOSCOPIC PROCEDURE TODAY AT THE [facility]:   Refer to the procedure report that was given to you for any specific questions about what was found during the examination.  If the procedure report does not answer your questions, please call your gastroenterologist to clarify.  If you requested that your care partner not be given the details of your procedure findings, then the procedure report has been included in a sealed envelope for you to review at your convenience later.  YOU SHOULD EXPECT: Some feelings of bloating in the abdomen. Passage of more gas than usual.  Walking can help get rid of the air that was put into your GI tract during the procedure and reduce the bloating. If you had a lower endoscopy (such as a colonoscopy or flexible sigmoidoscopy) you may notice spotting of blood in your stool or on the toilet paper. If you underwent a bowel prep for your procedure, you may not have a normal bowel movement for a few days.  Please Note:  You might notice some irritation and congestion in your nose or some drainage.  This is from the oxygen used during your procedure.  There is no need for concern and it should clear up in a day or so.  SYMPTOMS TO REPORT IMMEDIATELY:  Following lower endoscopy (colonoscopy or flexible sigmoidoscopy):  Excessive amounts of blood in the stool  Significant tenderness or worsening of abdominal pains  Swelling of the abdomen that is new, acute  Fever of 100F or higher   For urgent or emergent issues, a gastroenterologist can be reached at any hour by calling [phone]. Do not use MyChart messaging for urgent concerns.    DIET:  We do recommend a small meal at first, but then you may proceed to your regular diet.  Drink plenty of fluids but you should avoid alcoholic beverages for 24 hours.  ACTIVITY:  You should plan to  take it easy for the rest of today and you should NOT DRIVE or use heavy machinery until tomorrow (because of the sedation medicines used during the test).    FOLLOW UP: Our staff will call the number listed on your records the next business day following your procedure.  We will call around 7:15- 8:00 am to check on you and address any questions or concerns that you may have regarding the information given to you following your procedure. If we do not reach you, we will leave a message.     If any biopsies were taken you will be contacted by phone or by letter within the next 1-3 weeks.  Please call us  at [phone] if you have not heard about the biopsies in 3 weeks.    SIGNATURES/CONFIDENTIALITY: You and/or your care partner have signed paperwork which will be entered into your electronic medical record.  These signatures attest to the fact that that the information above on your After Visit Summary has been reviewed and is understood.  Full responsibility of the confidentiality of this discharge information lies with you and/or your care-partner.

## 2024-04-17 ENCOUNTER — Telehealth: Payer: Self-pay | Admitting: Lactation Services

## 2024-04-17 NOTE — Telephone Encounter (Signed)
  Follow up Call-     04/16/2024   10:34 AM  Call back number  Post procedure Call Back phone  # [phone]  Permission to leave phone message Yes     Patient questions:  Do you have a fever, pain , or abdominal swelling? No. Pain Score  0 *  Have you tolerated food without any problems? Yes.    Have you been able to return to your normal activities? Yes.    Do you have any questions about your discharge instructions: Diet   No. Medications  No. Follow up visit  No.  Do you have questions or concerns about your Care? No.  Actions: * If pain score is 4 or above: No action needed, pain <4.

## 2024-04-21 DIAGNOSIS — Z124 Encounter for screening for malignant neoplasm of cervix: Secondary | ICD-10-CM | POA: Diagnosis not present

## 2024-04-21 DIAGNOSIS — Z1231 Encounter for screening mammogram for malignant neoplasm of breast: Secondary | ICD-10-CM | POA: Diagnosis not present

## 2024-04-29 ENCOUNTER — Encounter: Payer: Self-pay | Admitting: Family

## 2024-04-30 DIAGNOSIS — D2272 Melanocytic nevi of left lower limb, including hip: Secondary | ICD-10-CM | POA: Diagnosis not present

## 2024-04-30 DIAGNOSIS — D225 Melanocytic nevi of trunk: Secondary | ICD-10-CM | POA: Diagnosis not present

## 2024-04-30 DIAGNOSIS — D2262 Melanocytic nevi of left upper limb, including shoulder: Secondary | ICD-10-CM | POA: Diagnosis not present

## 2024-04-30 DIAGNOSIS — G4733 Obstructive sleep apnea (adult) (pediatric): Secondary | ICD-10-CM | POA: Diagnosis not present

## 2024-05-30 DIAGNOSIS — G4733 Obstructive sleep apnea (adult) (pediatric): Secondary | ICD-10-CM | POA: Diagnosis not present

## 2024-06-19 ENCOUNTER — Ambulatory Visit: Payer: Self-pay | Admitting: Adult Health

## 2024-06-19 DIAGNOSIS — H1032 Unspecified acute conjunctivitis, left eye: Secondary | ICD-10-CM

## 2024-06-19 MED ORDER — GENTAMICIN SULFATE 0.3 % OP SOLN: 1.0000 [drp] | OPHTHALMIC | 0 refills | Status: AC

## 2024-06-19 NOTE — Progress Notes (Signed)
 " Virtual Visit Consent   Rhonda Santiago, you are scheduled for a virtual visit with a [facility] provider today. Just as with appointments in the office, your consent must be obtained to participate.   I need to obtain your verbal consent now. Are you willing to proceed with your visit today? Rhonda Santiago has provided verbal consent on 06/19/2024 for a virtual visit (video or telephone). Rhonda JINNY Howells, NP  Date: 06/19/2024 9:23 AM  Virtual Visit via Video Note   I, Rhonda Santiago, connected with  Rhonda Santiago  ([id number], 05/29/1971) on 06/19/24 at  9:30 AM EST by telephone and verified that I am speaking with the correct person using two identifiers.  Location: Patient: Virtual Visit Location Patient: Home Provider: Virtual Visit Location Provider: Office/Clinic   I discussed the limitations of evaluation and management by telemedicine and the availability of in person appointments. The patient expressed understanding and agreed to proceed.    History of Present Illness: Rhonda Santiago is a 54 y.o. and is being seen today reporting that for a few days her left eye has been watering. Today when she went to wipe it, it felt swollen/irritated.  [name]  HPI: HPI  Problems:  Patient Active Problem List   Diagnosis Date Noted   Carpal tunnel syndrome on right 01/02/2024   Snoring 04/19/2023   Seasonal allergies 04/19/2023   Iron deficiency anemia secondary to inadequate dietary iron intake 06/13/2022   Elevated LDL cholesterol level 01/12/2022   Class 3 severe obesity due to excess calories with body mass index (BMI) of 40.0 to 44.9 in adult [facility]) 01/12/2022    Allergies: Allergies[1] Medications: Current Medications[2]  Observations/Objective: No labored breathing.  Speech is clear and coherent with logical content.  Patient is alert and oriented at baseline.    Assessment and Plan: There are no diagnoses linked to this encounter. 1. Acute bacterial conjunctivitis  of left eye (Primary) Use Gentamicin  eye drops, 1 drop to affected eye(s) every 4 hours while awake.  Use them until symptosm resolve.  Wash hands frequently, and avoid touching eyes.   - gentamicin  (GARAMYCIN ) 0.3 % ophthalmic solution; Place 1 drop into both eyes every 4 (four) hours.  Dispense: 5 mL; Refill: 0     Follow Up Instructions: I discussed the assessment and treatment plan with the patient. The patient was provided an opportunity to ask questions and all were answered. The patient agreed with the plan and demonstrated an understanding of the instructions.    The patient was advised to call back or seek an in-person evaluation if the symptoms worsen or if the condition fails to improve as anticipated.  Time:  I spent 5 minutes with the patient via telehealth technology discussing the above problems/concerns.    Rhonda JINNY Howells, NP     [1] No Known Allergies [2]  Current Outpatient Medications:    cetirizine (ZYRTEC) 10 MG tablet, Take 10 mg by mouth daily., Disp: , Rfl:    HYDROcodone -acetaminophen  (NORCO/VICODIN) 5-325 MG tablet, Take 1 tablet by mouth every 4 (four) hours as needed for severe pain (pain score 7-10). (Patient not taking: No sig reported), Disp: 12 tablet, Rfl: 0   ibuprofen (ADVIL) 100 MG tablet, Take 100 mg by mouth every 6 (six) hours as needed for fever., Disp: , Rfl:    Multiple Vitamin (MULTI-VITAMINS) TABS, Take by mouth., Disp: , Rfl:    ondansetron  (ZOFRAN -ODT) 4 MG disintegrating tablet, 4mg  ODT q4 hours prn nausea/vomit (Patient  not taking: No sig reported), Disp: 10 tablet, Rfl: 0  "

## 2024-06-25 ENCOUNTER — Ambulatory Visit: Admitting: Family

## 2024-07-08 ENCOUNTER — Ambulatory Visit: Admitting: Sleep Medicine
